# Patient Record
Sex: Female | Born: 1950 | Race: White | Hispanic: No | Smoking: Former smoker
Health system: Southern US, Community
[De-identification: ages and names within clinical notes are randomized; demographics above are authoritative.]

## PROBLEM LIST (undated history)

## (undated) DIAGNOSIS — E039 Hypothyroidism, unspecified: Secondary | ICD-10-CM

## (undated) DIAGNOSIS — I499 Cardiac arrhythmia, unspecified: Secondary | ICD-10-CM

## (undated) DIAGNOSIS — M419 Scoliosis, unspecified: Secondary | ICD-10-CM

## (undated) DIAGNOSIS — Z8619 Personal history of other infectious and parasitic diseases: Secondary | ICD-10-CM

## (undated) DIAGNOSIS — F329 Major depressive disorder, single episode, unspecified: Secondary | ICD-10-CM

## (undated) DIAGNOSIS — C801 Malignant (primary) neoplasm, unspecified: Secondary | ICD-10-CM

## (undated) DIAGNOSIS — C50919 Malignant neoplasm of unspecified site of unspecified female breast: Secondary | ICD-10-CM

## (undated) DIAGNOSIS — E785 Hyperlipidemia, unspecified: Secondary | ICD-10-CM

## (undated) DIAGNOSIS — F32A Depression, unspecified: Secondary | ICD-10-CM

## (undated) DIAGNOSIS — E0590 Thyrotoxicosis, unspecified without thyrotoxic crisis or storm: Secondary | ICD-10-CM

## (undated) DIAGNOSIS — J45909 Unspecified asthma, uncomplicated: Secondary | ICD-10-CM

## (undated) HISTORY — PX: UPPER GI ENDOSCOPY: SHX6162

## (undated) HISTORY — PX: OTHER SURGICAL HISTORY: SHX169

## (undated) HISTORY — PX: ABDOMINAL HYSTERECTOMY: SHX81

## (undated) HISTORY — PX: CHOLECYSTECTOMY: SHX55

## (undated) HISTORY — PX: SPINAL FUSION: SHX223

## (undated) HISTORY — PX: MASTECTOMY: SHX3

---

## 2004-10-23 DIAGNOSIS — E162 Hypoglycemia, unspecified: Secondary | ICD-10-CM | POA: Insufficient documentation

## 2004-12-04 DIAGNOSIS — E038 Other specified hypothyroidism: Secondary | ICD-10-CM | POA: Insufficient documentation

## 2004-12-04 DIAGNOSIS — J019 Acute sinusitis, unspecified: Secondary | ICD-10-CM | POA: Insufficient documentation

## 2005-12-13 DIAGNOSIS — M25559 Pain in unspecified hip: Secondary | ICD-10-CM | POA: Insufficient documentation

## 2012-04-14 DIAGNOSIS — M4120 Other idiopathic scoliosis, site unspecified: Secondary | ICD-10-CM | POA: Insufficient documentation

## 2012-04-14 DIAGNOSIS — M546 Pain in thoracic spine: Secondary | ICD-10-CM | POA: Insufficient documentation

## 2012-06-24 LAB — HM COLONOSCOPY

## 2013-12-28 ENCOUNTER — Other Ambulatory Visit: Payer: Self-pay | Admitting: Family Medicine

## 2013-12-28 LAB — CBC WITH DIFFERENTIAL/PLATELET
Basophil #: 0 10*3/uL (ref 0.0–0.1)
Basophil %: 1.3 %
Eosinophil #: 0.2 10*3/uL (ref 0.0–0.7)
Eosinophil %: 5 %
HCT: 38.6 % (ref 35.0–47.0)
HGB: 13.3 g/dL (ref 12.0–16.0)
Lymphocyte #: 1.1 10*3/uL (ref 1.0–3.6)
Lymphocyte %: 27.6 %
MCH: 30.9 pg (ref 26.0–34.0)
MCHC: 34.6 g/dL (ref 32.0–36.0)
MCV: 89 fL (ref 80–100)
Monocyte #: 0.3 x10 3/mm (ref 0.2–0.9)
Monocyte %: 8.6 %
Neutrophil #: 2.2 10*3/uL (ref 1.4–6.5)
Neutrophil %: 57.5 %
Platelet: 224 10*3/uL (ref 150–440)
RBC: 4.32 10*6/uL (ref 3.80–5.20)
RDW: 12.8 % (ref 11.5–14.5)
WBC: 3.8 10*3/uL (ref 3.6–11.0)

## 2013-12-28 LAB — COMPREHENSIVE METABOLIC PANEL
Albumin: 4.1 g/dL (ref 3.4–5.0)
Alkaline Phosphatase: 70 U/L
Anion Gap: 2 — ABNORMAL LOW (ref 7–16)
BUN: 12 mg/dL (ref 7–18)
Bilirubin,Total: 0.4 mg/dL (ref 0.2–1.0)
Calcium, Total: 9.1 mg/dL (ref 8.5–10.1)
Chloride: 105 mmol/L (ref 98–107)
Co2: 31 mmol/L (ref 21–32)
Creatinine: 0.56 mg/dL — ABNORMAL LOW (ref 0.60–1.30)
EGFR (African American): >60
EGFR (Non-African Amer.): >60
Glucose: 84 mg/dL (ref 65–99)
Osmolality: 275 (ref 275–301)
Potassium: 4.2 mmol/L (ref 3.5–5.1)
SGOT(AST): 23 U/L (ref 15–37)
SGPT (ALT): 27 U/L (ref 12–78)
Sodium: 138 mmol/L (ref 136–145)
Total Protein: 7.5 g/dL (ref 6.4–8.2)

## 2013-12-28 LAB — TSH: Thyroid Stimulating Horm: 2.04 u[IU]/mL

## 2013-12-28 LAB — LIPID PANEL
Cholesterol: 232 mg/dL — ABNORMAL HIGH (ref 0–200)
HDL Cholesterol: 92 mg/dL — ABNORMAL HIGH (ref 40–60)
Ldl Cholesterol, Calc: 119 mg/dL — ABNORMAL HIGH (ref 0–100)
Triglycerides: 105 mg/dL (ref 0–200)
VLDL Cholesterol, Calc: 21 mg/dL (ref 5–40)

## 2014-02-04 ENCOUNTER — Ambulatory Visit: Payer: Self-pay | Admitting: Family Medicine

## 2014-05-04 ENCOUNTER — Ambulatory Visit: Payer: Self-pay | Admitting: Urology

## 2014-05-24 DIAGNOSIS — N390 Urinary tract infection, site not specified: Secondary | ICD-10-CM | POA: Insufficient documentation

## 2015-01-03 ENCOUNTER — Ambulatory Visit: Payer: Self-pay | Admitting: Nurse Practitioner

## 2015-01-03 LAB — COMPREHENSIVE METABOLIC PANEL
Albumin: 4.3 g/dL (ref 3.4–5.0)
Alkaline Phosphatase: 66 U/L
Anion Gap: 6 — ABNORMAL LOW (ref 7–16)
BUN: 11 mg/dL (ref 7–18)
Bilirubin,Total: 0.3 mg/dL (ref 0.2–1.0)
Calcium, Total: 8.5 mg/dL (ref 8.5–10.1)
Chloride: 101 mmol/L (ref 98–107)
Co2: 27 mmol/L (ref 21–32)
Creatinine: 0.64 mg/dL (ref 0.60–1.30)
EGFR (African American): >60
EGFR (Non-African Amer.): >60
Glucose: 86 mg/dL (ref 65–99)
Osmolality: 267 (ref 275–301)
Potassium: 3.7 mmol/L (ref 3.5–5.1)
SGOT(AST): 34 U/L (ref 15–37)
SGPT (ALT): 23 U/L
Sodium: 134 mmol/L — ABNORMAL LOW (ref 136–145)
Total Protein: 7.6 g/dL (ref 6.4–8.2)

## 2015-01-03 LAB — CBC WITH DIFFERENTIAL/PLATELET
Basophil #: 0 10*3/uL (ref 0.0–0.1)
Basophil %: 1.3 %
Eosinophil #: 0.1 10*3/uL (ref 0.0–0.7)
Eosinophil %: 2.4 %
HCT: 41.2 % (ref 35.0–47.0)
HGB: 13.7 g/dL (ref 12.0–16.0)
Lymphocyte #: 1 10*3/uL (ref 1.0–3.6)
Lymphocyte %: 28.2 %
MCH: 30.8 pg (ref 26.0–34.0)
MCHC: 33.2 g/dL (ref 32.0–36.0)
MCV: 93 fL (ref 80–100)
Monocyte #: 0.3 x10 3/mm (ref 0.2–0.9)
Monocyte %: 7.4 %
Neutrophil #: 2.2 10*3/uL (ref 1.4–6.5)
Neutrophil %: 60.7 %
Platelet: 272 10*3/uL (ref 150–440)
RBC: 4.43 10*6/uL (ref 3.80–5.20)
RDW: 12.8 % (ref 11.5–14.5)
WBC: 3.6 10*3/uL (ref 3.6–11.0)

## 2015-01-03 LAB — LIPID PANEL
Cholesterol: 253 mg/dL — ABNORMAL HIGH (ref 0–200)
HDL Cholesterol: 93 mg/dL — ABNORMAL HIGH (ref 40–60)
Ldl Cholesterol, Calc: 146 mg/dL — ABNORMAL HIGH (ref 0–100)
Triglycerides: 68 mg/dL (ref 0–200)
VLDL Cholesterol, Calc: 14 mg/dL (ref 5–40)

## 2015-01-03 LAB — TSH: Thyroid Stimulating Horm: 2.18 u[IU]/mL

## 2015-01-03 LAB — HEMOGLOBIN A1C: Hemoglobin A1C: 5.6 % (ref 4.2–6.3)

## 2015-01-03 LAB — T4, FREE: Free Thyroxine: 1.44 ng/dL (ref 0.76–1.46)

## 2015-01-07 DIAGNOSIS — C50911 Malignant neoplasm of unspecified site of right female breast: Secondary | ICD-10-CM | POA: Insufficient documentation

## 2015-03-09 ENCOUNTER — Ambulatory Visit: Payer: Self-pay | Admitting: Internal Medicine

## 2015-04-29 DIAGNOSIS — M549 Dorsalgia, unspecified: Secondary | ICD-10-CM | POA: Insufficient documentation

## 2015-04-29 DIAGNOSIS — G8929 Other chronic pain: Secondary | ICD-10-CM | POA: Insufficient documentation

## 2015-04-29 DIAGNOSIS — Z853 Personal history of malignant neoplasm of breast: Secondary | ICD-10-CM | POA: Insufficient documentation

## 2015-06-15 ENCOUNTER — Ambulatory Visit (INDEPENDENT_AMBULATORY_CARE_PROVIDER_SITE_OTHER): Payer: BLUE CROSS/BLUE SHIELD

## 2015-06-15 ENCOUNTER — Encounter: Payer: Self-pay | Admitting: Podiatry

## 2015-06-15 ENCOUNTER — Ambulatory Visit (INDEPENDENT_AMBULATORY_CARE_PROVIDER_SITE_OTHER): Payer: BLUE CROSS/BLUE SHIELD | Admitting: Podiatry

## 2015-06-15 VITALS — BP 125/75 | HR 63 | Resp 16

## 2015-06-15 DIAGNOSIS — Q828 Other specified congenital malformations of skin: Secondary | ICD-10-CM | POA: Diagnosis not present

## 2015-06-15 DIAGNOSIS — L84 Corns and callosities: Secondary | ICD-10-CM

## 2015-06-15 DIAGNOSIS — M779 Enthesopathy, unspecified: Secondary | ICD-10-CM

## 2015-06-15 NOTE — Progress Notes (Signed)
   Subjective:    Patient ID: Tanya Snyder, female    DOB: [birth year], 64 y.o.   MRN: [id number]  HPI Comments: "I have a callus"  Patient c/o aching plantar forefoot left for several months. The area is callused. She has tried trimming, padding and medicated disks. Not helping and becoming more sore with walking.     Review of Systems  Eyes: Positive for itching.  Respiratory: Positive for shortness of breath.   Endocrine: Positive for cold intolerance.  Musculoskeletal: Positive for back pain.  Allergic/Immunologic: Positive for environmental allergies.  Hematological: Bruises/bleeds easily.  All other systems reviewed and are negative.      Objective:   Physical Exam: I have reviewed her past medical history medications allergies surgery social history and review of systems area pulses are strongly palpable bilateral. Neurologic sensorium is intact per Semmes-Weinstein monofilament. Deep tendon reflexes are intact bilaterally muscle strength +5 over 5 dorsiflexion plantar flexors and inverters and everters all under the musculature is intact. Orthopedic evaluation demonstrates mild digital contraction flexible in nature toes 2 through 5 left. She has very prominent metatarsal heads to the third and fourth of the left foot. Cutaneous evaluation of straight supple well-hydrated cutis no erythema edema saline as drainage or odor with the exception of a mild area of erythema sub-third fourth metatarsophalangeal joint of the left foot where she has applied multiple skin products to help alleviate her symptoms. At this point a solitary 4 keratome was visualized beneath third metatarsophalangeal joint of the left foot. Skin is thin but does not appear to be infected.        Assessment & Plan:  Assessment: Fat pad atrophy with mild digital contraction leading to a porokeratosis sub-third metatarsal head left foot.  Plan: Discussed etiology pathology conservative or surgical  therapies sharp debridement today with chemical destruction under occlusion for 2-3 days. She will follow up with me as needed. We did discuss other alternatives such as orthotics as well as accommodation of other insoles.

## 2015-08-31 ENCOUNTER — Encounter: Payer: Self-pay | Admitting: *Deleted

## 2015-09-01 ENCOUNTER — Encounter
Admission: RE | Disposition: A | Discharge status: Home / Self Care | Payer: Self-pay | Source: Ambulatory Visit | Attending: Gastroenterology

## 2015-09-01 ENCOUNTER — Encounter: Payer: Self-pay | Admitting: Certified Registered Nurse Anesthetist

## 2015-09-01 ENCOUNTER — Ambulatory Visit
Admission: RE | Admit: 2015-09-01 | Discharge: 2015-09-01 | Disposition: A | Discharge status: Home / Self Care | Payer: Medicare Other | Source: Ambulatory Visit | Attending: Gastroenterology | Admitting: Gastroenterology

## 2015-09-01 DIAGNOSIS — F329 Major depressive disorder, single episode, unspecified: Secondary | ICD-10-CM | POA: Diagnosis not present

## 2015-09-01 DIAGNOSIS — Z09 Encounter for follow-up examination after completed treatment for conditions other than malignant neoplasm: Secondary | ICD-10-CM | POA: Insufficient documentation

## 2015-09-01 DIAGNOSIS — Z7951 Long term (current) use of inhaled steroids: Secondary | ICD-10-CM | POA: Insufficient documentation

## 2015-09-01 DIAGNOSIS — Z90710 Acquired absence of both cervix and uterus: Secondary | ICD-10-CM | POA: Diagnosis not present

## 2015-09-01 DIAGNOSIS — Z79891 Long term (current) use of opiate analgesic: Secondary | ICD-10-CM | POA: Diagnosis not present

## 2015-09-01 DIAGNOSIS — K644 Residual hemorrhoidal skin tags: Secondary | ICD-10-CM | POA: Diagnosis not present

## 2015-09-01 DIAGNOSIS — Z79899 Other long term (current) drug therapy: Secondary | ICD-10-CM | POA: Insufficient documentation

## 2015-09-01 DIAGNOSIS — Z981 Arthrodesis status: Secondary | ICD-10-CM | POA: Diagnosis not present

## 2015-09-01 DIAGNOSIS — J45909 Unspecified asthma, uncomplicated: Secondary | ICD-10-CM | POA: Diagnosis not present

## 2015-09-01 DIAGNOSIS — Z86010 Personal history of colonic polyps: Secondary | ICD-10-CM | POA: Diagnosis present

## 2015-09-01 DIAGNOSIS — Z853 Personal history of malignant neoplasm of breast: Secondary | ICD-10-CM | POA: Insufficient documentation

## 2015-09-01 DIAGNOSIS — E785 Hyperlipidemia, unspecified: Secondary | ICD-10-CM | POA: Diagnosis not present

## 2015-09-01 DIAGNOSIS — Z9010 Acquired absence of unspecified breast and nipple: Secondary | ICD-10-CM | POA: Insufficient documentation

## 2015-09-01 HISTORY — DX: Hyperlipidemia, unspecified: E78.5

## 2015-09-01 HISTORY — DX: Malignant neoplasm of unspecified site of unspecified female breast: C50.919

## 2015-09-01 HISTORY — DX: Unspecified asthma, uncomplicated: J45.909

## 2015-09-01 HISTORY — DX: Thyrotoxicosis, unspecified without thyrotoxic crisis or storm: E05.90

## 2015-09-01 HISTORY — DX: Major depressive disorder, single episode, unspecified: F32.9

## 2015-09-01 HISTORY — DX: Malignant (primary) neoplasm, unspecified: C80.1

## 2015-09-01 HISTORY — DX: Personal history of other infectious and parasitic diseases: Z86.19

## 2015-09-01 HISTORY — PX: COLONOSCOPY WITH PROPOFOL: SHX5780

## 2015-09-01 HISTORY — DX: Depression, unspecified: F32.A

## 2015-09-01 SURGERY — COLONOSCOPY WITH PROPOFOL
Anesthesia: General

## 2015-09-01 MED ORDER — MIDAZOLAM HCL 5 MG/5ML IJ SOLN
INTRAMUSCULAR | Status: DC | PRN
  Administered 2015-09-01 (×2): 1 mg via INTRAVENOUS
  Administered 2015-09-01 (×2): 2 mg via INTRAVENOUS

## 2015-09-01 MED ORDER — FENTANYL CITRATE (PF) 100 MCG/2ML IJ SOLN
INTRAMUSCULAR | Status: AC
  Filled 2015-09-01: qty 2

## 2015-09-01 MED ORDER — SODIUM CHLORIDE 0.9 % IV SOLN: INTRAVENOUS | Status: DC

## 2015-09-01 MED ORDER — FENTANYL CITRATE (PF) 100 MCG/2ML IJ SOLN
INTRAMUSCULAR | Status: DC | PRN
  Administered 2015-09-01: 25 ug via INTRAVENOUS
  Administered 2015-09-01 (×2): 50 ug via INTRAVENOUS
  Administered 2015-09-01: 25 ug via INTRAVENOUS

## 2015-09-01 MED ORDER — DIPHENHYDRAMINE HCL 50 MG/ML IJ SOLN
INTRAMUSCULAR | Status: DC | PRN
  Administered 2015-09-01: 25 mg via INTRAVENOUS

## 2015-09-01 MED ORDER — DIPHENHYDRAMINE HCL 50 MG/ML IJ SOLN
INTRAMUSCULAR | Status: AC
  Filled 2015-09-01: qty 1

## 2015-09-01 MED ORDER — MIDAZOLAM HCL 5 MG/5ML IJ SOLN
INTRAMUSCULAR | Status: AC
  Filled 2015-09-01: qty 10

## 2015-09-01 MED ORDER — SODIUM CHLORIDE 0.9 % IV SOLN
INTRAVENOUS | Status: DC
Start: 2015-09-01 — End: 2015-09-01
  Administered 2015-09-01: 1000 mL via INTRAVENOUS

## 2015-09-01 NOTE — H&P (Signed)
Primary Care Physician:  Tracie Harrier, MD  Pre-Procedure History & Physical: HPI:  Tanya Snyder is a 64 y.o. female is here for an colonoscopy.   Past Medical History  Diagnosis Date  . History of chicken pox   . Cancer   . Breast cancer   . Asthma   . Depression   . Hyperthyroidism   . Hyperlipemia     Past Surgical History  Procedure Laterality Date  . Spinal fusion    . Abdominal hysterectomy    . Mastectomy      Prior to Admission medications   Medication Sig Start Date End Date Taking? Authorizing Provider  alendronate (FOSAMAX) 70 MG tablet Take 70 mg by mouth once a week. Take with a full glass of water on an empty stomach.   Yes Historical Provider, MD  aluminum hydroxide-magnesium carbonate (GAVISCON) 95-358 MG/15ML SUSP Take 15 mLs by mouth.   Yes Historical Provider, MD  ascorbic acid (VITAMIN C) 1000 MG tablet Take 1,000 mg by mouth daily.   Yes Historical Provider, MD  calcium carbonate (TUMS - DOSED IN MG ELEMENTAL CALCIUM) 500 MG chewable tablet Chew 1 tablet by mouth daily.   Yes Historical Provider, MD  clonazePAM (KLONOPIN) 0.5 MG tablet Take 0.5 mg by mouth 2 (two) times daily as needed for anxiety.   Yes Historical Provider, MD  D-MANNOSE PO Take by mouth.   Yes Historical Provider, MD  diphenhydrAMINE (BENADRYL) 25 MG tablet Take 25 mg by mouth every 6 (six) hours as needed.   Yes Historical Provider, MD  Fluticasone-Salmeterol (ADVAIR HFA IN) Inhale into the lungs.   Yes Historical Provider, MD  gabapentin (NEURONTIN) 100 MG capsule Take 100 mg by mouth 3 (three) times daily.   Yes Historical Provider, MD  Levothyroxine Sodium (SYNTHROID PO) Take by mouth.   Yes Historical Provider, MD  Methenamine-Sodium Salicylate (CYSTEX) 297-989.2 MG TABS Take by mouth.   Yes Historical Provider, MD  oxyCODONE-acetaminophen (PERCOCET/ROXICET) 5-325 MG per tablet Take 1 tablet by mouth every 4 (four) hours as needed for severe pain.   Yes Historical  Provider, MD    Allergies as of 06/23/2015  . (No Known Allergies)    History reviewed. No pertinent family history.  Social History   Social History  . Marital Status: Divorced    Spouse Name: N/A  . Number of Children: N/A  . Years of Education: N/A   Occupational History  . Not on file.   Social History Main Topics  . Smoking status: Never Smoker   . Smokeless tobacco: Not on file  . Alcohol Use: No  . Drug Use: Not on file  . Sexual Activity: Not on file   Other Topics Concern  . Not on file   Social History Narrative     Physical Exam: BP 130/84 mmHg  Pulse 79  Temp(Src) 97.8 F (36.6 C) (Tympanic)  Resp 16  Ht 5\' 9"  (1.753 m)  Wt 68.04 kg (150 lb)  BMI 22.14 kg/m2  SpO2 100% General:   Alert,  pleasant and cooperative in NAD Head:  Normocephalic and atraumatic. Neck:  Supple; no masses or thyromegaly. Lungs:  Clear throughout to auscultation.    Heart:  Regular rate and rhythm. Abdomen:  Soft, nontender and nondistended. Normal bowel sounds, without guarding, and without rebound.   Neurologic:  Alert and  oriented x4;  grossly normal neurologically.  Impression/Plan: Tanya Snyder is here for an colonoscopy to be performed for suveillancce, hx TA  Risks, benefits,  limitations, and alternatives regarding  colonoscopy have been reviewed with the patient.  Questions have been answered.  All parties agreeable.   Josefine Class, MD  09/01/2015, 9:39 AM

## 2015-09-01 NOTE — Op Note (Signed)
[facility] Patient Name: Tanya Snyder Procedure Date: 09/01/2015 9:43 AM MRN: [id number] Account #: [id number] Date of Birth: [birth year] Admit Type: Outpatient Age: 64 Room: [facility] ENDO ROOM 3 Gender: Female Note Status: Finalized Procedure:         Colonoscopy Indications:       Surveillance: Personal history of adenomatous polyps on                     last colonoscopy 3 years ago Patient Profile:   This is a 64 year old female. Providers:         Gerrit Heck. Rayann Heman, MD Referring MD:      Tracie Harrier, MD (Referring MD) Medicines:         Fentanyl 150 micrograms IV, Midazolam 6 mg IV,                     Diphenhydramine 25 mg IV Complications:     No immediate complications. Procedure:         Pre-Anesthesia Assessment:                    - Prior to the procedure, a History and Physical was                     performed, and patient medications, allergies and                     sensitivities were reviewed. The patient's tolerance of                     previous anesthesia was reviewed.                    - Prior to the procedure, a History and Physical was                     performed, and patient medications, allergies and                     sensitivities were reviewed. The patient's tolerance of                     previous anesthesia was reviewed.                    After obtaining informed consent, the colonoscope was                     passed under direct vision. Throughout the procedure, the                     patient's blood pressure, pulse, and oxygen saturations                     were monitored continuously. The Colonoscope was                     introduced through the anus and advanced to the the cecum,                     identified by appendiceal orifice and ileocecal valve. The                     colonoscopy was performed without difficulty. The patient  tolerated the procedure well. The quality  of the bowel                     preparation was excellent. Findings:      The perianal exam findings include non-thrombosed external hemorrhoids.      The entire examined colon appeared normal on direct and retroflexion       views. Impression:        - Non-thrombosed external hemorrhoids found on perianal                     exam.                    - The entire examined colon is normal on direct and                     retroflexion views.                    - No specimens collected. Recommendation:    - Observe patient in GI recovery unit.                    - High fiber diet.                    - Continue present medications.                    - Repeat colonoscopy in 5 years for surveillance.                    - Return to referring physician.                    - The findings and recommendations were discussed with the                     patient.                    - The findings and recommendations were discussed with the                     patient's family. Procedure Code(s): --- Professional ---                    [phone], Colonoscopy, flexible; diagnostic, including                     collection of specimen(s) by brushing or washing, when                     performed (separate procedure) CPT copyright 2014 American Medical [employer]. All rights reserved. The codes documented in this report are preliminary and upon coder review may  be revised to meet current compliance requirements. Mellody Life, MD 09/01/2015 10:26:26 AM This report has been signed electronically. Number of Addenda: 0 Note Initiated On: 09/01/2015 9:43 AM Scope Withdrawal Time: 0 hours 9 minutes 17 seconds  Total Procedure Duration: 0 hours 18 minutes 40 seconds       [facility]

## 2015-09-02 ENCOUNTER — Encounter: Payer: Self-pay | Admitting: Gastroenterology

## 2015-11-09 ENCOUNTER — Ambulatory Visit: Payer: BLUE CROSS/BLUE SHIELD | Admitting: Podiatry

## 2015-11-30 ENCOUNTER — Ambulatory Visit: Payer: Medicare Other | Admitting: Podiatry

## 2015-12-07 ENCOUNTER — Ambulatory Visit (INDEPENDENT_AMBULATORY_CARE_PROVIDER_SITE_OTHER): Payer: Medicare Other | Admitting: Podiatry

## 2015-12-07 ENCOUNTER — Encounter: Payer: Self-pay | Admitting: Podiatry

## 2015-12-07 DIAGNOSIS — B070 Plantar wart: Secondary | ICD-10-CM | POA: Diagnosis not present

## 2015-12-07 DIAGNOSIS — Q828 Other specified congenital malformations of skin: Secondary | ICD-10-CM

## 2015-12-07 NOTE — Progress Notes (Signed)
She presents today with a chief complaint of a painful lesion to the plantar aspect of her left foot would like to have trimmed.  Objective: Vital signs are stable she is alert and oriented 3. Pulses are strongly palpable. Porokeratotic lesion which appears to be developing some early changes of verruca sub-fourth metatarsal phalangeal joint area of the left foot.  Assessment: Porokeratosis left foot.  Plan: Debridement of reactive hyperkeratotic tissue and then placed salicylic acid under occlusion to be left on for 3 days without getting wet. I will follow-up with her in the near future for reevaluation.

## 2016-03-07 ENCOUNTER — Ambulatory Visit: Payer: Medicare Other | Admitting: Podiatry

## 2016-03-14 ENCOUNTER — Ambulatory Visit: Payer: Medicare Other | Admitting: Podiatry

## 2016-05-15 ENCOUNTER — Ambulatory Visit: Payer: Medicare Other | Admitting: Podiatry

## 2016-05-30 ENCOUNTER — Encounter: Payer: Self-pay | Admitting: Podiatry

## 2016-05-30 ENCOUNTER — Ambulatory Visit (INDEPENDENT_AMBULATORY_CARE_PROVIDER_SITE_OTHER): Payer: Medicare Other | Admitting: Podiatry

## 2016-05-30 DIAGNOSIS — Q828 Other specified congenital malformations of skin: Secondary | ICD-10-CM

## 2016-05-30 NOTE — Progress Notes (Signed)
She presents today with chief complaint of painful porokeratotic lesions plantar aspect bilateral foot.  Objective: Vital signs are stable she is alert and oriented 3 multiple porokeratosis. Most painful lesion is some fifth metatarsal head of the left foot which is ears to be really deep. No open lesions or wounds are noted.  Assessment: Pain in limb secondary porokeratosis bilateral.  Plan: Mechanical debridement as well as chemical debridement under occlusion was also requested to be left on for 3 days without getting wet. She is to remove the bandage at that time a wash the foot thoroughly. Otherwise I will follow up with her on an as-needed basis.

## 2016-08-07 DIAGNOSIS — M76891 Other specified enthesopathies of right lower limb, excluding foot: Secondary | ICD-10-CM | POA: Insufficient documentation

## 2016-08-16 DIAGNOSIS — G8929 Other chronic pain: Secondary | ICD-10-CM | POA: Insufficient documentation

## 2016-09-03 ENCOUNTER — Ambulatory Visit (INDEPENDENT_AMBULATORY_CARE_PROVIDER_SITE_OTHER): Payer: Medicare Other | Admitting: Podiatry

## 2016-09-03 ENCOUNTER — Encounter: Payer: Self-pay | Admitting: Podiatry

## 2016-09-03 DIAGNOSIS — Q828 Other specified congenital malformations of skin: Secondary | ICD-10-CM | POA: Diagnosis not present

## 2016-09-03 NOTE — Progress Notes (Signed)
She presents today for follow-up of calluses to the plantar aspect the bilateral foot left being greater than the right. She states that she is overweight of [place] would like to be able to walk.  Objective: Vital signs are stable she's alert 3. Pulses are palpable. Reactive hyperkeratosis plantar aspect of the bilateral foot left greater than right no open lesions or wounds.  Assessment: Porokeratosis left greater than right.  Plan: Debridement of all reactive hyperkeratosis manually and chemically on the left foot. She will leave this on salicylic acid under occlusion for 3 days then washed off thoroughly notify me with questions or concerns. Follow up with me in a few months.

## 2016-12-21 DIAGNOSIS — Z87440 Personal history of urinary (tract) infections: Secondary | ICD-10-CM | POA: Insufficient documentation

## 2016-12-21 DIAGNOSIS — F988 Other specified behavioral and emotional disorders with onset usually occurring in childhood and adolescence: Secondary | ICD-10-CM | POA: Insufficient documentation

## 2017-04-22 ENCOUNTER — Ambulatory Visit (INDEPENDENT_AMBULATORY_CARE_PROVIDER_SITE_OTHER): Payer: Medicare Other | Admitting: Podiatry

## 2017-04-22 ENCOUNTER — Encounter: Payer: Self-pay | Admitting: Podiatry

## 2017-04-22 DIAGNOSIS — Q828 Other specified congenital malformations of skin: Secondary | ICD-10-CM

## 2017-04-22 NOTE — Progress Notes (Signed)
She is presenting today to complaint of painful calluses plantar aspect of bilateral forefoot.  Objective: Vital signs are stable she is alert and oriented 3. Pulses are palpable. She has multiple porokeratotic lesions to the forefoot bilateral 1 on the plantar lateral aspect of the left foot just distal to the heel. No open lesions no open wounds no signs of infection.  Assessment: Porokeratosis bilateral.  Plan: Debridement of reactive hyperkeratosis bilateral.

## 2017-09-18 ENCOUNTER — Encounter: Payer: Self-pay | Admitting: Podiatry

## 2017-09-18 ENCOUNTER — Ambulatory Visit (INDEPENDENT_AMBULATORY_CARE_PROVIDER_SITE_OTHER): Payer: Medicare Other | Admitting: Podiatry

## 2017-09-18 DIAGNOSIS — M779 Enthesopathy, unspecified: Secondary | ICD-10-CM

## 2017-09-18 DIAGNOSIS — Q828 Other specified congenital malformations of skin: Secondary | ICD-10-CM | POA: Diagnosis not present

## 2017-09-18 NOTE — Progress Notes (Signed)
She presents today for follow-up of calluses plantar aspect of the forefoot bilateral. Also complaining of pain to the forefoot right.  Objective: Vital signs are stable she is alert and oriented 3. Pulses are palpable. Neurologic sensorium is intact. She has pain on palpation and range of motion of the second third metatarsophalangeal joints of the right foot. Small reactive hyperkeratosis plantar aspect of the forefoot.  Assessment: Capsulitis bursitis forefoot right. Multiple benign soft tissue lesions plantar aspect of bilateral foot.  Plan: Debridement of all reactive hyperkeratotic tissue injected 20 mg of Kenalog and local anesthetic to the third interspace of the right foot.

## 2017-09-24 ENCOUNTER — Ambulatory Visit (INDEPENDENT_AMBULATORY_CARE_PROVIDER_SITE_OTHER): Payer: Medicare Other | Admitting: Psychology

## 2017-09-24 DIAGNOSIS — F4323 Adjustment disorder with mixed anxiety and depressed mood: Secondary | ICD-10-CM | POA: Diagnosis not present

## 2017-10-10 ENCOUNTER — Ambulatory Visit (INDEPENDENT_AMBULATORY_CARE_PROVIDER_SITE_OTHER): Payer: Medicare Other | Admitting: Psychology

## 2017-10-10 DIAGNOSIS — F4323 Adjustment disorder with mixed anxiety and depressed mood: Secondary | ICD-10-CM

## 2017-10-23 ENCOUNTER — Ambulatory Visit (INDEPENDENT_AMBULATORY_CARE_PROVIDER_SITE_OTHER): Payer: Medicare Other | Admitting: Psychology

## 2017-10-23 DIAGNOSIS — F4323 Adjustment disorder with mixed anxiety and depressed mood: Secondary | ICD-10-CM

## 2017-11-11 ENCOUNTER — Ambulatory Visit: Payer: Medicare Other | Admitting: Podiatry

## 2017-11-13 ENCOUNTER — Ambulatory Visit (INDEPENDENT_AMBULATORY_CARE_PROVIDER_SITE_OTHER): Payer: Medicare Other | Admitting: Psychology

## 2017-11-13 DIAGNOSIS — F4323 Adjustment disorder with mixed anxiety and depressed mood: Secondary | ICD-10-CM

## 2017-11-27 ENCOUNTER — Ambulatory Visit: Payer: Medicare Other | Admitting: Psychology

## 2017-12-23 ENCOUNTER — Ambulatory Visit: Payer: Medicare Other | Admitting: Podiatry

## 2018-04-09 ENCOUNTER — Other Ambulatory Visit: Payer: Self-pay

## 2018-04-09 ENCOUNTER — Encounter: Payer: Self-pay | Admitting: Emergency Medicine

## 2018-04-09 ENCOUNTER — Emergency Department
Admission: EM | Admit: 2018-04-09 | Discharge: 2018-04-09 | Disposition: A | Discharge status: Home / Self Care | Payer: Medicare PPO | Attending: Emergency Medicine | Admitting: Emergency Medicine

## 2018-04-09 DIAGNOSIS — J45909 Unspecified asthma, uncomplicated: Secondary | ICD-10-CM | POA: Diagnosis not present

## 2018-04-09 DIAGNOSIS — L03211 Cellulitis of face: Secondary | ICD-10-CM

## 2018-04-09 DIAGNOSIS — R51 Headache: Secondary | ICD-10-CM | POA: Diagnosis present

## 2018-04-09 DIAGNOSIS — A46 Erysipelas: Secondary | ICD-10-CM | POA: Insufficient documentation

## 2018-04-09 DIAGNOSIS — Z853 Personal history of malignant neoplasm of breast: Secondary | ICD-10-CM | POA: Insufficient documentation

## 2018-04-09 MED ORDER — AMOXICILLIN-POT CLAVULANATE 875-125 MG PO TABS
1.0000 | ORAL_TABLET | Freq: Once | ORAL | Status: AC
  Administered 2018-04-09: 1 via ORAL
  Filled 2018-04-09: qty 1

## 2018-04-09 MED ORDER — PREDNISONE 20 MG PO TABS: 40.0000 mg | ORAL_TABLET | Freq: Every day | ORAL | 0 refills | Status: AC

## 2018-04-09 MED ORDER — AMOXICILLIN-POT CLAVULANATE 875-125 MG PO TABS: 1.0000 | ORAL_TABLET | Freq: Two times a day (BID) | ORAL | 0 refills | Status: AC

## 2018-04-09 MED ORDER — PREDNISONE 20 MG PO TABS
40.0000 mg | ORAL_TABLET | Freq: Once | ORAL | Status: AC
  Administered 2018-04-09: 40 mg via ORAL
  Filled 2018-04-09: qty 2

## 2018-04-09 NOTE — ED Provider Notes (Signed)
[facility] Emergency Department Provider Note       Time seen: ----------------------------------------- 7:18 AM on 04/09/2018 -----------------------------------------   I have reviewed the triage vital signs and the nursing notes.  HISTORY   Chief Complaint facial pain    HPI Tanya Snyder is a 67 y.o. female with a history of asthma, cancer, depression, hyperlipidemia and hyperthyroidism who presents to the ED for right-sided facial pain.  Patient states it feels warm to the touch, she is not sure if it was her ear or if it was dental or could be sinus related.  Patient states she received a cortisone injection in her hip to make sure it did not cause the infection.  She denies fevers, chills or other complaints.  Past Medical History:  Diagnosis Date  . Asthma   . Breast cancer ([place])   . Cancer ([place])   . Depression   . History of chicken pox   . Hyperlipemia   . Hyperthyroidism     There are no active problems to display for this patient.   Past Surgical History:  Procedure Laterality Date  . ABDOMINAL HYSTERECTOMY    . COLONOSCOPY WITH PROPOFOL N/A 09/01/2015   Procedure: COLONOSCOPY WITH PROPOFOL;  Surgeon: Josefine Class, MD;  Location: [facility] ENDOSCOPY;  Service: Endoscopy;  Laterality: N/A;  . MASTECTOMY    . SPINAL FUSION      Allergies Patient has no known allergies.  Social History Social History   Tobacco Use  . Smoking status: Never Smoker  . Smokeless tobacco: Never Used  Substance Use Topics  . Alcohol use: No    Alcohol/week: 0.0 oz  . Drug use: Not on file   Review of Systems Constitutional: Negative for fever. ENT: Positive for right-sided facial pain and warmth Cardiovascular: Negative for chest pain. Respiratory: Negative for shortness of breath. Gastrointestinal: Negative for abdominal pain, vomiting and diarrhea. Musculoskeletal: Negative for back pain. Skin: Positive for facial  erythema Neurological: Negative for headaches, focal weakness or numbness.  All systems negative/normal/unremarkable except as stated in the HPI  ____________________________________________   PHYSICAL EXAM:  VITAL SIGNS: ED Triage Vitals  Enc Vitals Group     BP 04/09/18 0713 (!) 182/98     Pulse Rate 04/09/18 0713 85     Resp 04/09/18 0713 16     Temp 04/09/18 0713 98.7 F (37.1 C)     Temp Source 04/09/18 0713 Oral     SpO2 04/09/18 0713 97 %     Weight 04/09/18 0713 154 lb (69.9 kg)     Height 04/09/18 0713 5\' 9"  (1.753 m)     Head Circumference --      Peak Flow --      Pain Score 04/09/18 0712 7     Pain Loc --      Pain Edu? --      Excl. in [place]? --    Constitutional: Alert and oriented.  Anxious, no distress Eyes: Conjunctivae are normal. Normal extraocular movements. ENT   Head: Normocephalic and atraumatic.  Right-sided facial swelling and erythema with sharply demarcated borders, right greater than left.  TMs are clear   Nose: No congestion/rhinnorhea.   Mouth/Throat: Mucous membranes are moist.  Good dentition, no tenderness   Neck: No stridor. Cardiovascular: Normal rate, regular rhythm. No murmurs, rubs, or gallops. Respiratory: Normal respiratory effort without tachypnea nor retractions. Breath sounds are clear and equal bilaterally. No wheezes/rales/rhonchi. Musculoskeletal: Nontender with normal range of motion in extremities. No  lower extremity tenderness nor edema. Neurologic:  Normal speech and language. No gross focal neurologic deficits are appreciated.  Skin: Facial erythema, right greater than left Psychiatric: Anxious ____________________________________________  ED COURSE:  As part of my medical decision making, I reviewed the following data within the [facility] History obtained from family if available, nursing notes, old chart and ekg, as well as notes from prior ED visits. Patient presented for redness and  swelling which appears to be cellulitis, patient be started on Augmentin and prednisone.   Procedures ____________________________________________  DIFFERENTIAL DIAGNOSIS   Cellulitis, abscess unlikely, lupus unlikely  FINAL ASSESSMENT AND PLAN  Erysipelas   Plan: The patient had presented for facial swelling and erythema.  Clinically she has facial cellulitis and will be started on Augmentin and a short burst of steroids.  Patient is agreeable to plan, stable for outpatient follow-up.   Laurence Aly, MD   Note: This note was generated in part or whole with voice recognition software. Voice recognition is usually quite accurate but there are transcription errors that can and very often do occur. I apologize for any typographical errors that were not detected and corrected.     Earleen Newport, MD 04/09/18 [phone]

## 2018-04-09 NOTE — ED Triage Notes (Signed)
Pt here for right side face pain to cheek.  "I don't know if it is my ear, could be dental, could be sinus".  Reports right cheek feels warm.  Both cheeks feel warm to touch.  "I just got a cortisone injection in my hip and want to make sure it is not causing infection".  All symptoms to face.  Pt also thinks could be having a stroke. Speech clear.  Ambulatory with steady gait.  No facial asymmetry.  Very anxious in triage. When pt asked temperature and RN told her no fever 98.7  Pt states "that is for me I run a whole degree lower."

## 2018-04-09 NOTE — ED Notes (Signed)
Pt notified nurse of swelling of throat. MD Jimmye Norman notified and at bedside.

## 2018-06-02 ENCOUNTER — Ambulatory Visit: Payer: Medicare Other | Admitting: Podiatry

## 2018-06-02 ENCOUNTER — Encounter: Payer: Self-pay | Admitting: Podiatry

## 2018-06-02 DIAGNOSIS — Q828 Other specified congenital malformations of skin: Secondary | ICD-10-CM

## 2018-06-02 NOTE — Progress Notes (Signed)
Complaint:  Visit Type: Patient returns to my office for continued preventative foot care services. Complaint: Patient states she is going on vacation and desires to have her callus worked on before her vacation.  The patient presents for preventative foot care services. No changes to ROS  Podiatric Exam: Vascular: dorsalis pedis and posterior tibial pulses are palpable bilateral. Capillary return is immediate. Temperature gradient is WNL. Skin turgor WNL  Sensorium: Normal Semmes Weinstein monofilament test. Normal tactile sensation bilaterally. Nail Exam: Normal nails with no evidence of fungal or bacterial infection. Ulcer Exam: There is no evidence of ulcer or pre-ulcerative changes or infection. Orthopedic Exam: Muscle tone and strength are WNL. No limitations in general ROM. No crepitus or effusions noted. Foot type and digits show no abnormalities. Bony prominences are unremarkable. Skin:  Porokeratosis sub 4 left and sub 3 right. No infection or ulcers  Diagnosis:Porokeratosis  B/L  Treatment & Plan Procedures and Treatment: Consent by patient was obtained for treatment procedures.   Debridement of porokeratotic lesions.   Return Visit-Office Procedure: Patient instructed to return to the office for a follow up visit 3 months for continued evaluation and treatment.    Gardiner Barefoot DPM

## 2018-06-20 ENCOUNTER — Other Ambulatory Visit: Payer: Self-pay | Admitting: Gastroenterology

## 2018-06-20 DIAGNOSIS — R1013 Epigastric pain: Secondary | ICD-10-CM

## 2018-06-25 ENCOUNTER — Ambulatory Visit
Admission: RE | Admit: 2018-06-25 | Discharge: 2018-06-25 | Disposition: A | Discharge status: Home / Self Care | Payer: Medicare PPO | Source: Ambulatory Visit | Attending: Gastroenterology | Admitting: Gastroenterology

## 2018-06-25 DIAGNOSIS — R1013 Epigastric pain: Secondary | ICD-10-CM | POA: Diagnosis present

## 2018-06-25 DIAGNOSIS — I700 Atherosclerosis of aorta: Secondary | ICD-10-CM | POA: Diagnosis not present

## 2018-08-30 DIAGNOSIS — N2889 Other specified disorders of kidney and ureter: Secondary | ICD-10-CM | POA: Insufficient documentation

## 2018-09-03 ENCOUNTER — Ambulatory Visit (INDEPENDENT_AMBULATORY_CARE_PROVIDER_SITE_OTHER): Payer: Medicare PPO | Admitting: Podiatry

## 2018-09-03 ENCOUNTER — Encounter: Payer: Self-pay | Admitting: Podiatry

## 2018-09-03 DIAGNOSIS — M216X9 Other acquired deformities of unspecified foot: Secondary | ICD-10-CM

## 2018-09-03 NOTE — Progress Notes (Signed)
She presents today would like to have her calluses trimmed by Dr. Sharyon Cable makes it cost too much.  States she has tried doing it herself and has resulted in severe calluses and blisters and superficial ulcerations.  So she wants [place] to do it for her however she was the insurance to pay for it.  Objective: Vital signs are stable she is stable she is alert and oriented x3.  Reactive hyper keratomas appear to be in good shape at this point plantar aspect of the bilateral foot there is no erythema cellulitis drainage or odor and appear to be thin down quite nicely.  Assessment: Reactive porokeratosis plantar aspect of the bilateral foot.  Plan: We discussed this in great detail today she understands that insurance will no longer pay this and that she will have to do this herself or she will have to pay for [place] to do it she does not like those options very much but states that she will notify us should she have any

## 2019-03-18 DIAGNOSIS — Z9011 Acquired absence of right breast and nipple: Secondary | ICD-10-CM | POA: Insufficient documentation

## 2019-06-05 DIAGNOSIS — I341 Nonrheumatic mitral (valve) prolapse: Secondary | ICD-10-CM | POA: Insufficient documentation

## 2019-09-23 ENCOUNTER — Other Ambulatory Visit: Payer: Self-pay | Admitting: Otolaryngology

## 2019-09-23 DIAGNOSIS — H9201 Otalgia, right ear: Secondary | ICD-10-CM

## 2019-10-05 ENCOUNTER — Other Ambulatory Visit: Payer: Self-pay

## 2019-10-05 ENCOUNTER — Ambulatory Visit
Admission: RE | Admit: 2019-10-05 | Discharge: 2019-10-05 | Disposition: A | Discharge status: Home / Self Care | Payer: Medicare Other | Source: Ambulatory Visit | Attending: Otolaryngology | Admitting: Otolaryngology

## 2019-10-05 DIAGNOSIS — H9201 Otalgia, right ear: Secondary | ICD-10-CM | POA: Insufficient documentation

## 2020-01-20 IMAGING — US US ABDOMEN COMPLETE
1 series · 14 of 25 positions shown · non-contrast
Comparison: CT abdomen pelvis 05/04/2014

CLINICAL DATA: Epigastric pain for 6 months

EXAM:
ABDOMEN ULTRASOUND COMPLETE

[Series 1: us abdomen complete · 0.19mm/px · 14 of 124 slices shown]
[im 1/124]
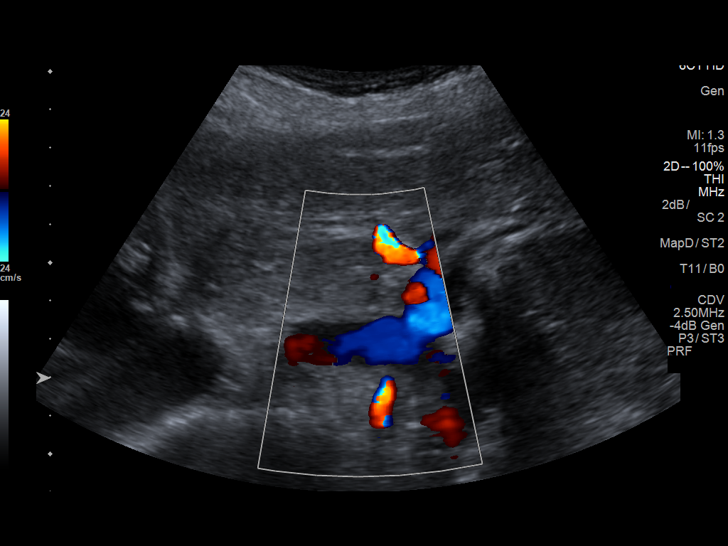
[im 11/124]
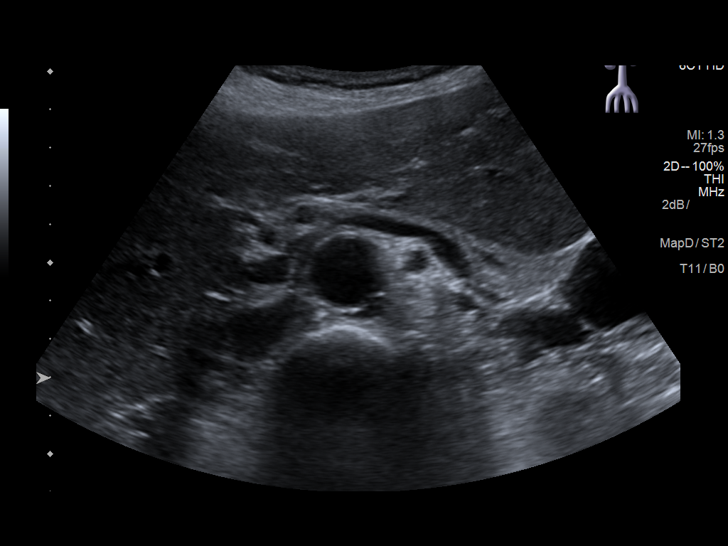
[im 21/124]
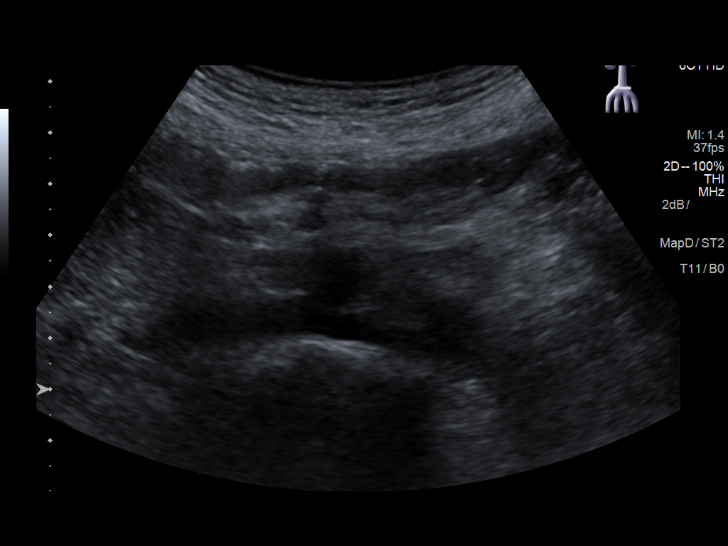
[im 31/124]
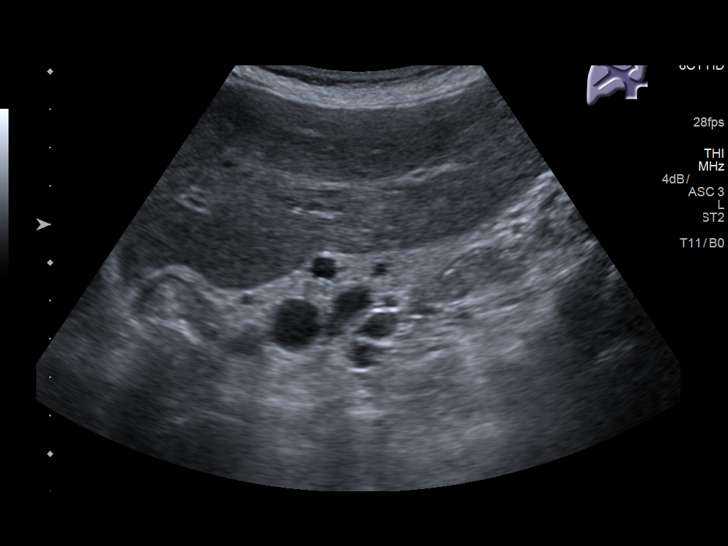
[im 42/124]
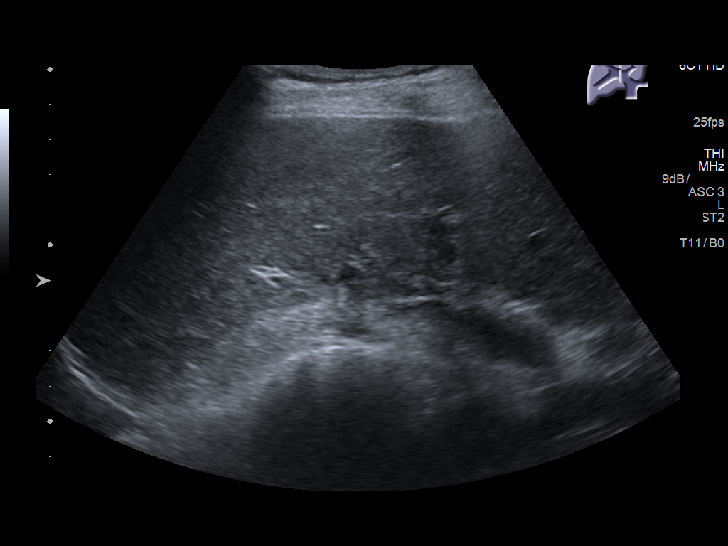
[im 47/124]
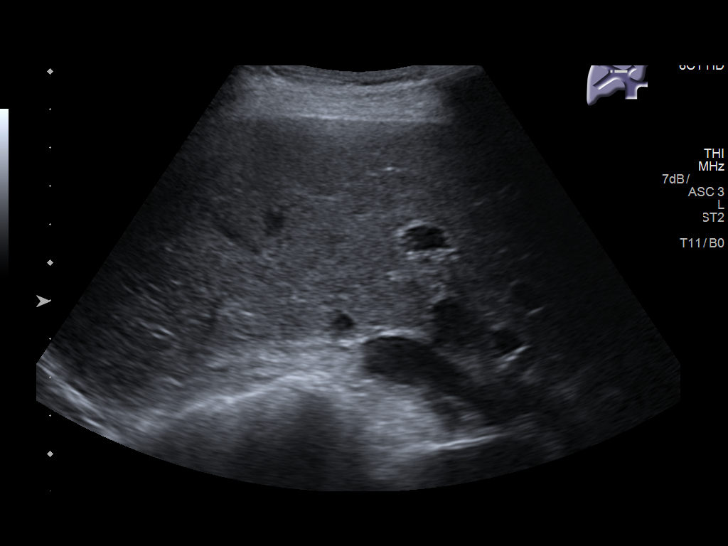
[im 57/124]
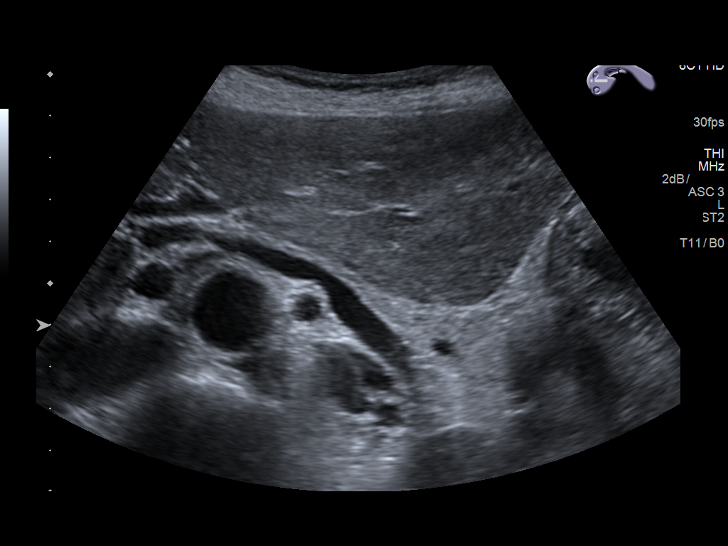
[im 67/124]
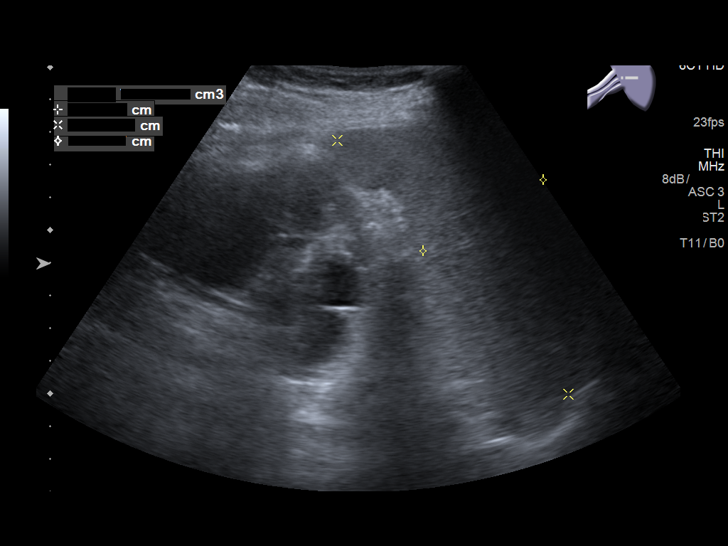
[im 77/124]
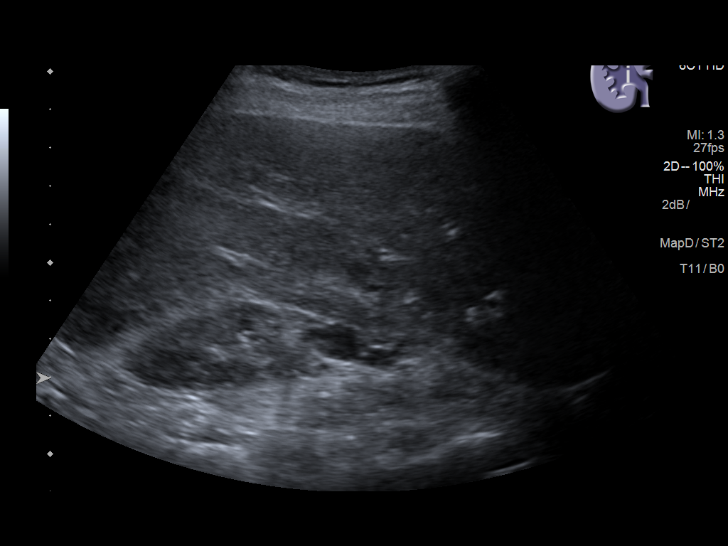
[im 83/124]
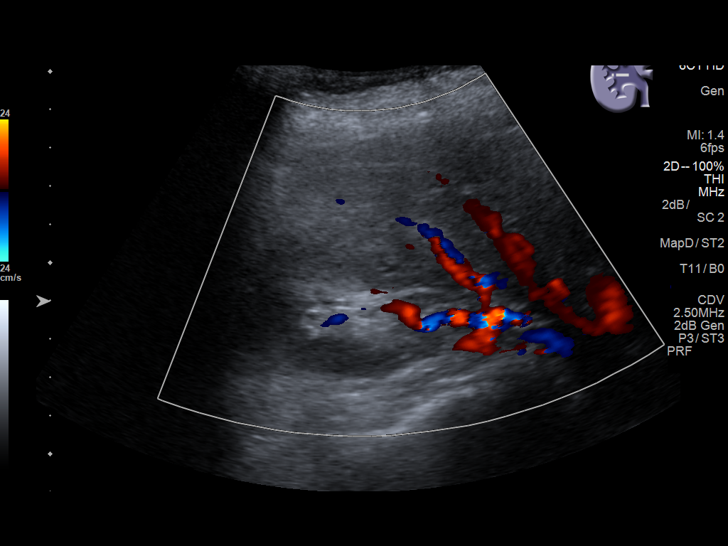
[im 93/124]
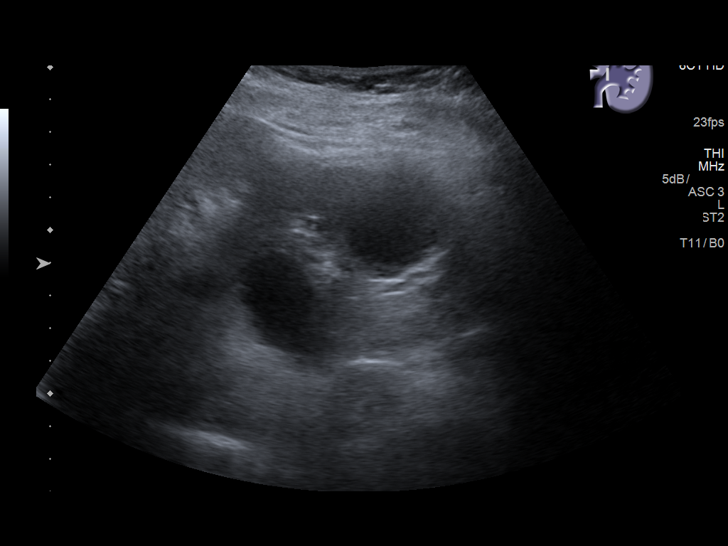
[im 103/124]
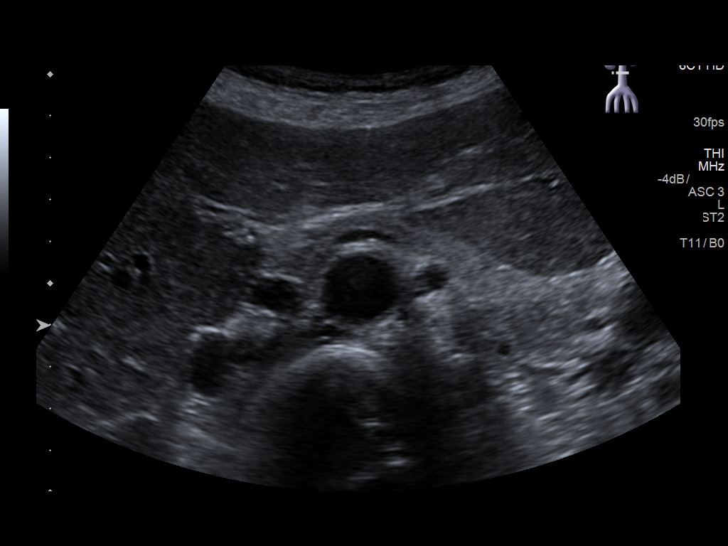
[im 113/124]
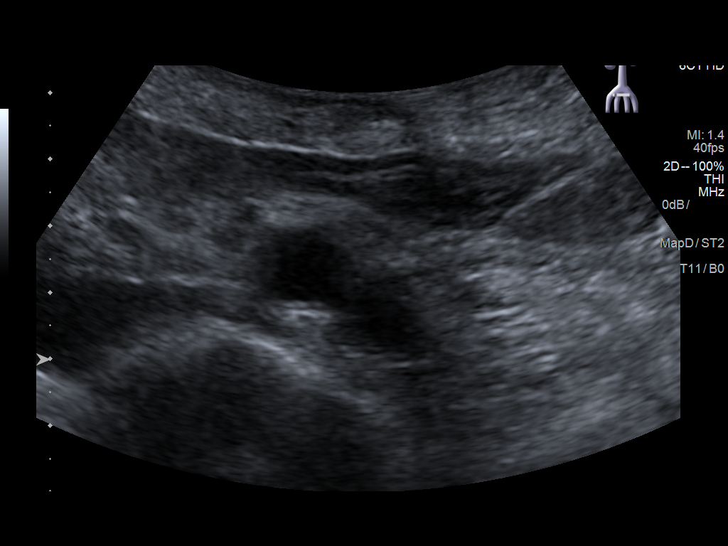
[im 124/124]
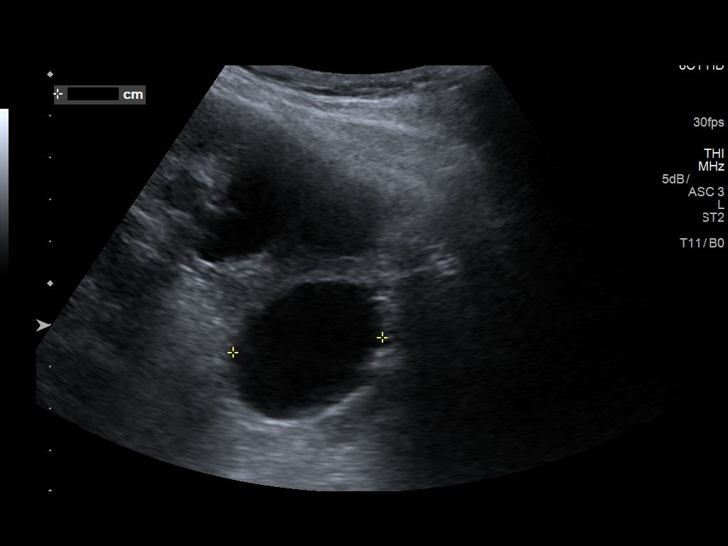

[14 of 25 positions shown; findings below may reference images not displayed]

FINDINGS: Gallbladder: Surgically absent.

Common bile duct: Diameter: 7 mm

Liver: No focal lesion identified. Within normal limits in
parenchymal echogenicity. Portal vein is patent on color Doppler
imaging with normal direction of blood flow towards the liver.

IVC: No abnormality visualized.

Pancreas: Visualized portion unremarkable.

Spleen: Size and appearance within normal limits.

Right Kidney: Length: 11.3 cm. Echogenicity within normal limits. No
mass or hydronephrosis visualized. Lower pole cysts measure 0.8 x
0.7 x 0.6 cm. Mild pelviectasis.

Left Kidney: Length: 12.4 cm. Echogenicity within normal limits. No
mass or hydronephrosis visualized. There are 2 relatively large
renal cysts. At the lower pole, a cyst measures 4.2 x 2.9 x 3.6 cm.
In the interpolar region, the cyst measures 4.4 x 4 5 x 5.4 cm.

Abdominal aorta: No aneurysm visualized.  Mild atherosclerosis.

Other findings: None.
IMPRESSION: 1. No acute abdominal abnormality.
2.  Aortic Atherosclerosis ([id number]).

## 2020-03-02 IMAGING — CT CT TEMPORAL BONES W/O CM
2 of 7 series · 13 of 40 positions shown, 16 images · non-contrast
Comparison: No pertinent prior studies available for comparison.

CLINICAL DATA: otalgia, right. Additional history provided: Patient
reports having right ear pain for the past 4-5 months, balance
issues as well.

EXAM:
CT TEMPORAL BONES WITHOUT CONTRAST
TECHNIQUE: Axial and coronal plane CT imaging of the petrous temporal bones was
performed with thin-collimation image reconstruction. No intravenous
contrast was administered. Multiplanar CT image reconstructions were
also generated.

[Series 8: cor bone temperal bones · coronal · 0.18mm/px · 2 of 212 slices shown]
[im 71/212  bone]
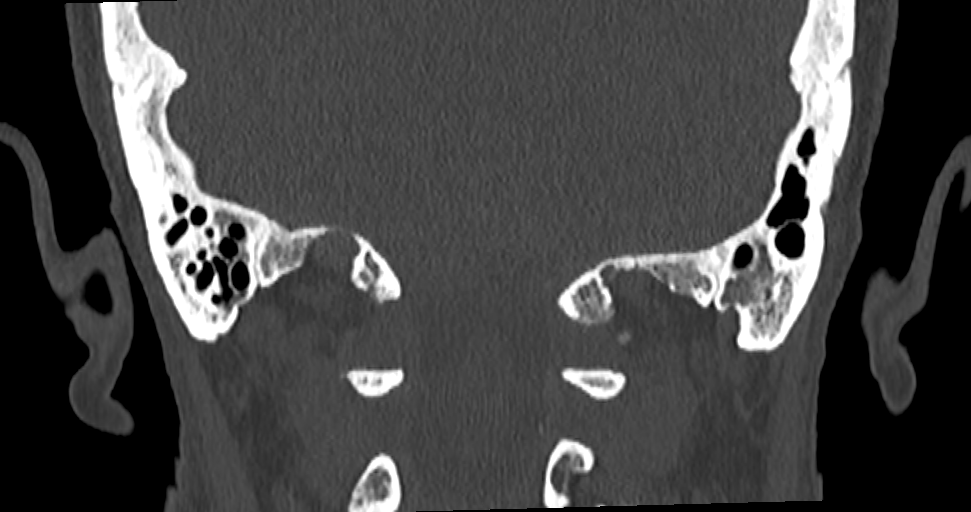
[im 141/212  bone]
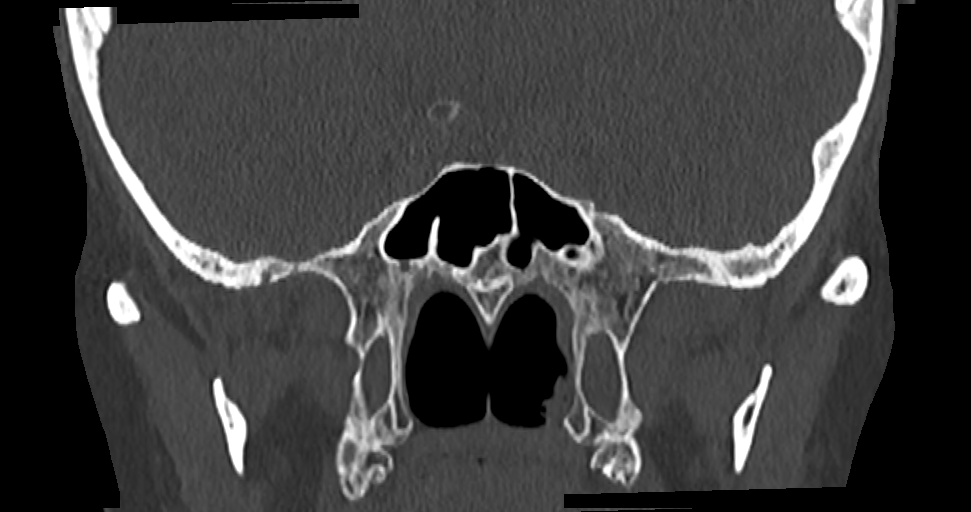

[Series 10: ax mag right temperal bones · axial · 0.20mm/px · z∈[-600,-526]mm · 11 of 149 slices shown, 14 images]
[im 13/149  brain]
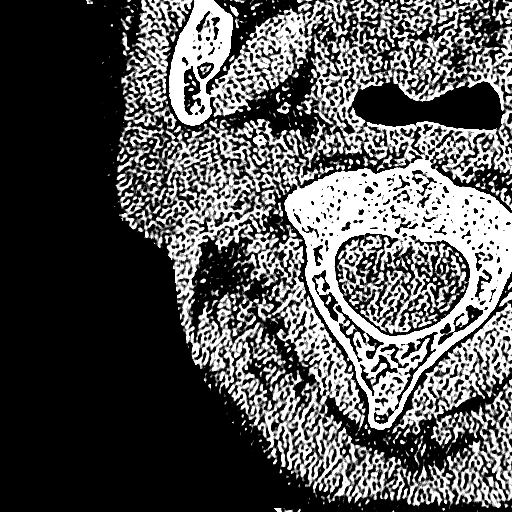
[im 13/149  bone]
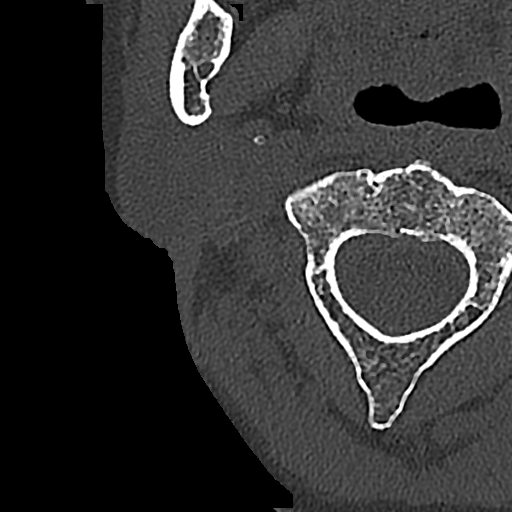
[im 25/149  bone]
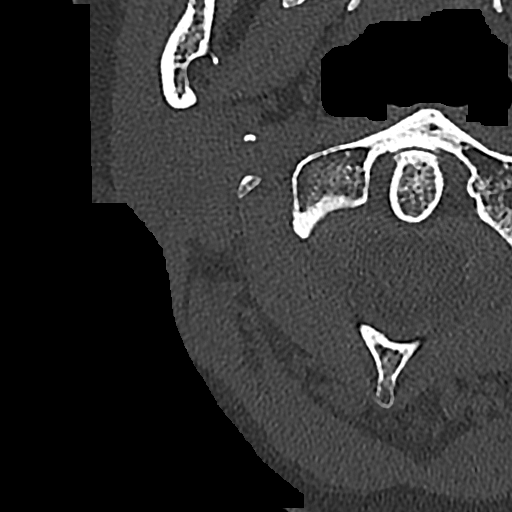
[im 38/149  bone]
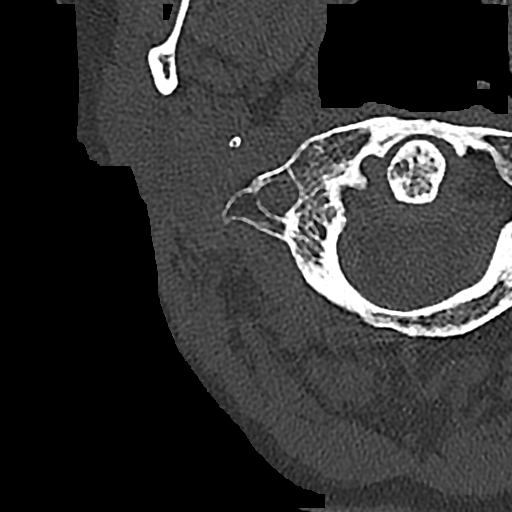
[im 50/149  bone]
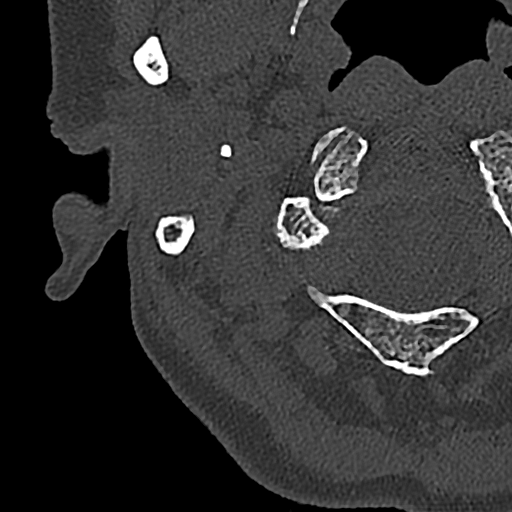
[im 62/149  brain]
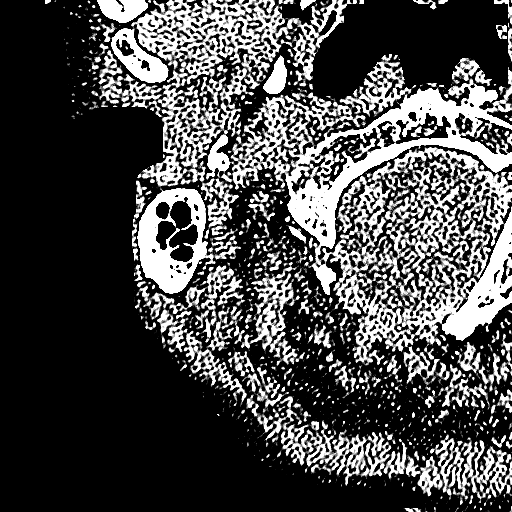
[im 62/149  bone]
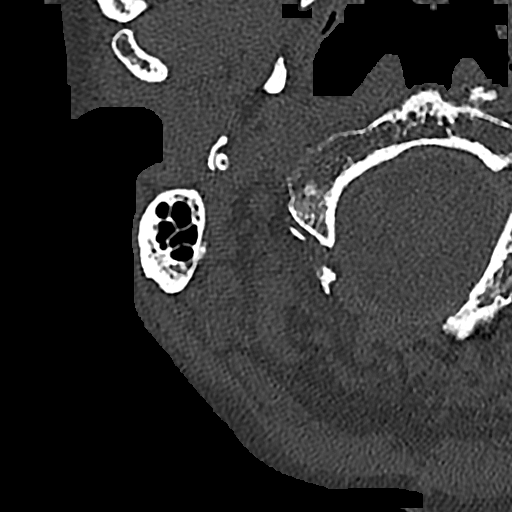
[im 75/149  bone]
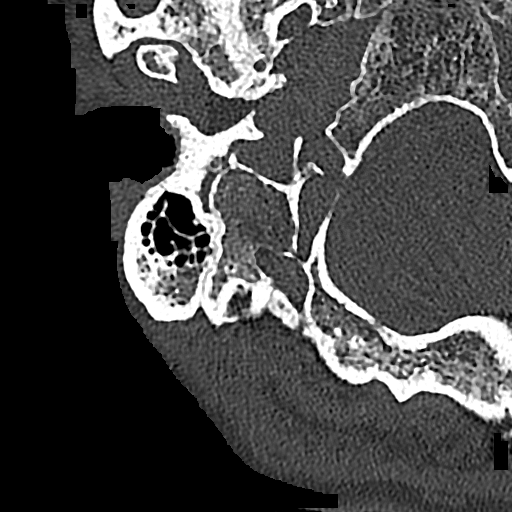
[im 87/149  bone]
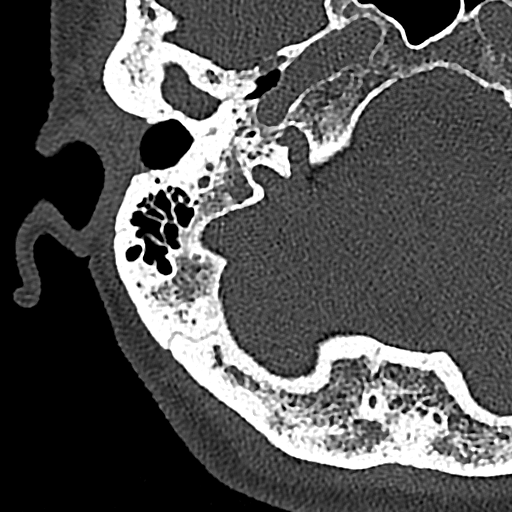
[im 99/149  bone]
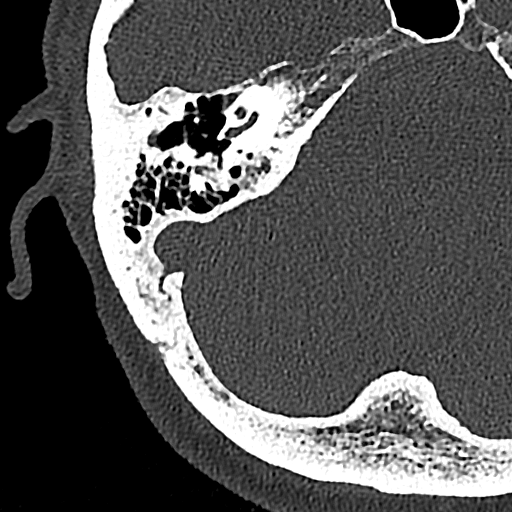
[im 112/149  brain]
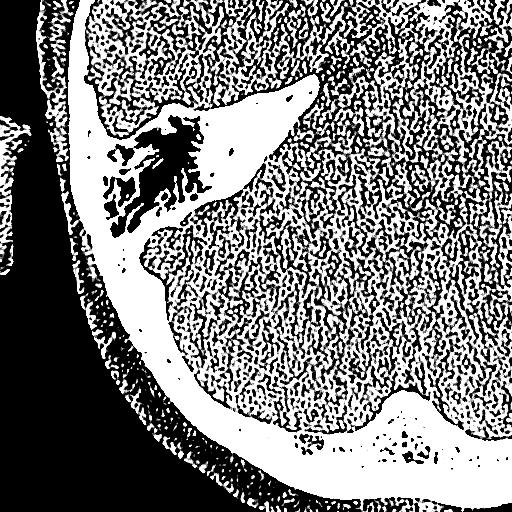
[im 112/149  bone]
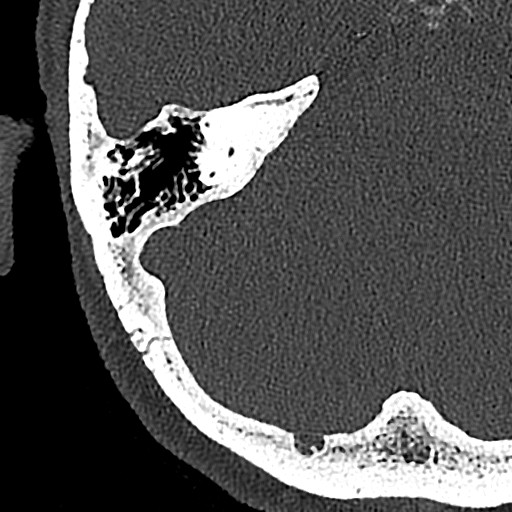
[im 124/149  bone]
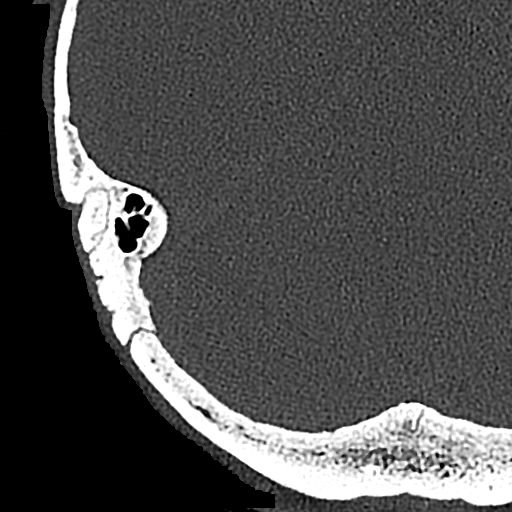
[im 136/149  bone]
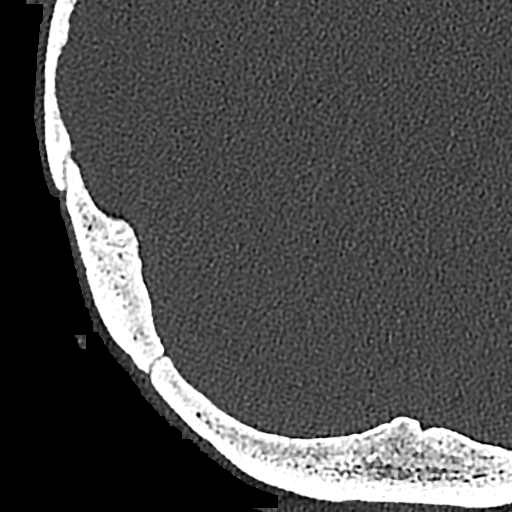

[13 of 40 positions shown; findings below may reference images not displayed]

FINDINGS: RIGHT:

The external auditory canal is patent. The middle ear cavity is well
aerated. The ossicles are unremarkable. The inner ear structures,
internal auditory and facial nerve canals are normal. The mastoid
air cells are well aerated

LEFT:

There is minimal debris within the left external auditory canal. The
middle ear cavity is well aerated. The ossicles are unremarkable.
The inner ear structures, internal auditory and facial nerve canals
are normal. The mastoid air cells are well aerated.

The orbits are unremarkable. Postsurgical appearance of the
paranasal sinuses. Minimal ethmoid sinus mucosal thickening. Mild
mucosal thickening within the imaged maxillary sinuses.
IMPRESSION: Unremarkable CT of the temporal bones as detailed.

Postsurgical appearance of the paranasal sinuses with mild paranasal
sinus mucosal thickening.

## 2020-07-05 ENCOUNTER — Ambulatory Visit: Payer: Medicare Other | Admitting: Gastroenterology

## 2020-07-05 ENCOUNTER — Other Ambulatory Visit: Payer: Self-pay

## 2020-07-05 ENCOUNTER — Encounter: Payer: Self-pay | Admitting: Gastroenterology

## 2020-07-05 ENCOUNTER — Ambulatory Visit (INDEPENDENT_AMBULATORY_CARE_PROVIDER_SITE_OTHER): Payer: Medicare Other | Admitting: Gastroenterology

## 2020-07-05 VITALS — BP 145/81 | HR 77 | Temp 98.2°F | Ht 69.0 in | Wt 154.1 lb

## 2020-07-05 DIAGNOSIS — K644 Residual hemorrhoidal skin tags: Secondary | ICD-10-CM | POA: Diagnosis not present

## 2020-07-05 DIAGNOSIS — J45909 Unspecified asthma, uncomplicated: Secondary | ICD-10-CM | POA: Insufficient documentation

## 2020-07-05 DIAGNOSIS — F32A Depression, unspecified: Secondary | ICD-10-CM | POA: Insufficient documentation

## 2020-07-05 DIAGNOSIS — E785 Hyperlipidemia, unspecified: Secondary | ICD-10-CM | POA: Insufficient documentation

## 2020-07-05 NOTE — Patient Instructions (Signed)

## 2020-07-05 NOTE — Progress Notes (Signed)
Cephas Darby, MD [address]  [place], [place] [address]  Main: [phone]  Fax: [phone]    Gastroenterology Consultation  Referring Provider:     Tracie Harrier, MD Primary Care Physician:  Tracie Harrier, MD Primary Gastroenterologist:  Dr. Cephas Darby Reason for Consultation:     Hemorrhoids        HPI:   Tanya Snyder is a 69 y.o. female referred by Dr. Tracie Harrier, MD  for consultation & management of hemorrhoids.  Patient reports that she has noticed external hemorrhoid since her pregnancy at age 69.  Her symptoms are primarily intermittent rectal bleeding and prolapse of the hemorrhoid that she has to push inside.  She reports increased size of the prolapsed tissue during bowel movement.  She denies rectal pain, discomfort, itching, leakage.  She does report lower abdominal bloating.  She does report irregular bowel habits and tries to avoid bread.  She takes MiraLAX and fiber supplement as needed for constipation.  She denies any weight loss.  She would like to know if the hemorrhoid can be removed.  She does not have anemia.  NSAIDs: None  Antiplts/Anticoagulants/Anti thrombotics: None  GI Procedures: Surveillance colonoscopy 09/01/2015 by Dr. Rayann Heman for personal history of colon polyp - Non-thrombosed external hemorrhoids found on perianal exam. - The entire examined colon is normal on direct and retroflexion views. - No specimens collected.  Past Medical History:  Diagnosis Date  . Asthma   . Breast cancer ([place])   . Cancer ([place])   . Depression   . History of chicken pox   . Hyperlipemia   . Hyperthyroidism     Past Surgical History:  Procedure Laterality Date  . ABDOMINAL HYSTERECTOMY    . COLONOSCOPY WITH PROPOFOL N/A 09/01/2015   Procedure: COLONOSCOPY WITH PROPOFOL;  Surgeon: Josefine Class, MD;  Location: [facility] ENDOSCOPY;  Service: Endoscopy;  Laterality: N/A;  . MASTECTOMY    . SPINAL FUSION       Current Outpatient Medications:  .  albuterol (VENTOLIN HFA) 108 (90 Base) MCG/ACT inhaler, SMARTSIG:1-2 Puff(s) Via Inhaler Every 6 Hours PRN, Disp: , Rfl:  .  aluminum hydroxide-magnesium carbonate (GAVISCON) 95-358 MG/15ML SUSP, Take 15 mLs by mouth., Disp: , Rfl:  .  ascorbic acid (VITAMIN C) 1000 MG tablet, Take 1,000 mg by mouth daily., Disp: , Rfl:  .  calcium carbonate (TUMS - DOSED IN MG ELEMENTAL CALCIUM) 500 MG chewable tablet, Chew 1 tablet by mouth daily., Disp: , Rfl:  .  clobetasol ointment (TEMOVATE) 0.05 %, Apply topically daily., Disp: , Rfl:  .  clonazePAM (KLONOPIN) 0.5 MG tablet, Take 0.5 mg by mouth 2 (two) times daily as needed for anxiety., Disp: , Rfl:  .  cyclobenzaprine (FLEXERIL) 5 MG tablet, Take 5 mg by mouth at bedtime as needed., Disp: , Rfl:  .  D-MANNOSE PO, Take by mouth., Disp: , Rfl:  .  diphenhydrAMINE (BENADRYL) 25 MG tablet, Take 25 mg by mouth every 6 (six) hours as needed., Disp: , Rfl:  .  EPINEPHrine 0.3 mg/0.3 mL IJ SOAJ injection, Inject into the muscle as directed., Disp: , Rfl:  .  ezetimibe (ZETIA) 10 MG tablet, Take 10 mg by mouth daily., Disp: , Rfl:  .  hydrOXYzine (ATARAX/VISTARIL) 25 MG tablet, Take 25 mg by mouth at bedtime as needed., Disp: , Rfl:  .  levothyroxine (SYNTHROID) 125 MCG tablet, Take 125 mcg by mouth daily., Disp: , Rfl:  .  Methenamine-Sodium  Salicylate (CYSTEX) 324-401.0 MG TABS, Take by mouth., Disp: , Rfl:  .  montelukast (SINGULAIR) 10 MG tablet, Take by mouth., Disp: , Rfl:  .  nitrofurantoin (MACRODANTIN) 50 MG capsule, Take by mouth., Disp: , Rfl:  .  oxyCODONE-acetaminophen (PERCOCET/ROXICET) 5-325 MG per tablet, Take 1 tablet by mouth every 4 (four) hours as needed for severe pain., Disp: , Rfl:  .  ramelteon (ROZEREM) 8 MG tablet, Take by mouth., Disp: , Rfl:  .  triamcinolone (NASACORT) 55 MCG/ACT AERO nasal inhaler, Place into the nose., Disp: , Rfl:  .  triamcinolone cream (KENALOG) 0.5 %, Apply topically 2  (two) times daily., Disp: , Rfl:  .  VYVANSE 40 MG capsule, Take 40 mg by mouth at bedtime., Disp: , Rfl:  .  WIXELA INHUB 250-50 MCG/DOSE AEPB, 1 puff 2 (two) times daily., Disp: , Rfl:  .  zolpidem (AMBIEN) 5 MG tablet, Take 5 mg by mouth at bedtime as needed., Disp: , Rfl:    History reviewed. No pertinent family history.   Social History   Tobacco Use  . Smoking status: Never Smoker  . Smokeless tobacco: Never Used  Substance Use Topics  . Alcohol use: No    Alcohol/week: 0.0 standard drinks  . Drug use: Not on file    Allergies as of 07/05/2020 - Review Complete 07/05/2020  Allergen Reaction Noted  . Acetaminophen Nausea Only 09/18/2006  . Hydrocodone-acetaminophen Other (See Comments) 11/30/2019    Review of Systems:    All systems reviewed and negative except where noted in HPI.   Physical Exam:  BP (!) 145/81 (BP Location: Left Arm, Patient Position: Sitting, Cuff Size: Normal)   Pulse 77   Temp 98.2 F (36.8 C) (Oral)   Ht 5\' 9"  (1.753 m)   Wt 154 lb 2 oz (69.9 kg)   BMI 22.76 kg/m  No LMP recorded. Patient has had a hysterectomy.  General:   Alert,  Well-developed, well-nourished, pleasant and cooperative in NAD Head:  Normocephalic and atraumatic. Eyes:  Sclera clear, no icterus.   Conjunctiva pink. Ears:  Normal auditory acuity. Nose:  No deformity, discharge, or lesions. Mouth:  No deformity or lesions,oropharynx pink & moist. Neck:  Supple; no masses or thyromegaly. Lungs:  Respirations even and unlabored.  Clear throughout to auscultation.   No wheezes, crackles, or rhonchi. No acute distress. Heart:  Regular rate and rhythm; no murmurs, clicks, rubs, or gallops. Abdomen:  Normal bowel sounds. Soft, non-tender and mildly distended, tympanic to percussion without masses, hepatosplenomegaly or hernias noted.  No guarding or rebound tenderness.   Rectal: Large perianal skin tag extending from right anterior external hemorrhoid, nontender,  erythematous Msk:  Symmetrical without gross deformities. Good, equal movement & strength bilaterally. Pulses:  Normal pulses noted. Extremities:  No clubbing or edema.  No cyanosis. Neurologic:  Alert and oriented x3;  grossly normal neurologically. Skin:  Intact without significant lesions or rashes. No jaundice. Psych:  Alert and cooperative. Normal mood and affect.  Imaging Studies: Reviewed  Assessment and Plan:   DEMIKA LANGENDERFER is a 69 y.o. female who is a breast cancer survivor, chronic constipation is seen in consultation for symptomatic hemorrhoids  Chronic constipation Discussed with her about adequate intake of water, fiber supplements and stool softener, information provided  Symptomatic hemorrhoid Perianal exam reveals large perianal skin tag rather than an external hemorrhoid Discussed with her about surgical excision.  However, patient reports that her symptoms are manageable and would like to defer removal of  skin tag at this time There is no role for banding at this time as her symptoms are on minimal  Personal history of colon polyp Patient is overdue for colonoscopy.  However, patient prefers to have colonoscopy performed by Dr. Allen Norris as she said, she was told by her PCP that Dr. Allen Norris is experienced in performing colonoscopies  Follow up as needed   Cephas Darby, MD

## 2020-07-05 NOTE — Progress Notes (Signed)
error 

## 2020-08-23 ENCOUNTER — Emergency Department: Payer: Medicare Other

## 2020-08-23 ENCOUNTER — Other Ambulatory Visit: Payer: Self-pay

## 2020-08-23 ENCOUNTER — Emergency Department
Admission: EM | Admit: 2020-08-23 | Discharge: 2020-08-23 | Disposition: A | Discharge status: Home / Self Care | Payer: Medicare Other | Attending: Emergency Medicine | Admitting: Emergency Medicine

## 2020-08-23 DIAGNOSIS — Z5321 Procedure and treatment not carried out due to patient leaving prior to being seen by health care provider: Secondary | ICD-10-CM | POA: Insufficient documentation

## 2020-08-23 DIAGNOSIS — Y9389 Activity, other specified: Secondary | ICD-10-CM | POA: Diagnosis not present

## 2020-08-23 DIAGNOSIS — M79672 Pain in left foot: Secondary | ICD-10-CM | POA: Insufficient documentation

## 2020-08-23 DIAGNOSIS — Y999 Unspecified external cause status: Secondary | ICD-10-CM | POA: Diagnosis not present

## 2020-08-23 DIAGNOSIS — X58XXXA Exposure to other specified factors, initial encounter: Secondary | ICD-10-CM | POA: Diagnosis not present

## 2020-08-23 DIAGNOSIS — Y92810 Car as the place of occurrence of the external cause: Secondary | ICD-10-CM | POA: Insufficient documentation

## 2020-08-23 NOTE — ED Triage Notes (Signed)
Pt complains of left foot pain after stepping on emergency brake of car. Pt without obvious injury. Cms intact.

## 2020-09-08 ENCOUNTER — Ambulatory Visit: Payer: Medicare Other | Admitting: Gastroenterology

## 2020-10-25 DIAGNOSIS — R002 Palpitations: Secondary | ICD-10-CM | POA: Insufficient documentation

## 2020-11-04 ENCOUNTER — Other Ambulatory Visit: Payer: Self-pay

## 2020-11-08 ENCOUNTER — Encounter: Payer: Self-pay | Admitting: Gastroenterology

## 2020-11-08 ENCOUNTER — Other Ambulatory Visit: Payer: Self-pay

## 2020-11-08 ENCOUNTER — Ambulatory Visit: Payer: Medicare Other | Admitting: Gastroenterology

## 2020-11-08 VITALS — BP 134/80 | HR 70 | Temp 97.6°F | Wt 157.5 lb

## 2020-11-08 DIAGNOSIS — Z86010 Personal history of colonic polyps: Secondary | ICD-10-CM

## 2020-11-08 MED ORDER — NA SULFATE-K SULFATE-MG SULF 17.5-3.13-1.6 GM/177ML PO SOLN: 354.0000 mL | Freq: Once | ORAL | 0 refills | Status: AC

## 2020-11-08 NOTE — Progress Notes (Unsigned)
supr

## 2020-11-08 NOTE — Progress Notes (Signed)
Primary Care Physician: Tracie Harrier, MD  Primary Gastroenterologist:  Dr. Lucilla Lame  Chief Complaint  Patient presents with  . Follow-up    discuss procedure     HPI: Tanya Snyder is a 69 y.o. female here with a history of colon polyps.  The patient also has a history of gastritis.  The patient was seen by Dr. Marius Ditch and her note was reviewed. The patient want to switch over to my care due to recommendations.  The patient had a colonoscopy by Dr. Rayann Heman in 2016 with a history of adenomatous polyps. The patient also had gastritis on upper endoscopy in 2012. The patient was put on Nexium in the past but does not take it on a regular basis.  She states that she has anywhere from 4-6 episodes of severe abdominal pain that usually last 2-3 days and she treated with antacids or taking a PPI.  She denies any unexplained weight loss fevers chills nausea or vomiting at this time.  The patient has had back surgery for scoliosis.  She states that the surgery was in 1965.  Her last EGD also recommended no further EGDs unless she was having any new problems.  Past Medical History:  Diagnosis Date  . Asthma   . Breast cancer ([place])   . Cancer ([place])   . Depression   . History of chicken pox   . Hyperlipemia   . Hyperthyroidism     Current Outpatient Medications  Medication Sig Dispense Refill  . albuterol (VENTOLIN HFA) 108 (90 Base) MCG/ACT inhaler SMARTSIG:1-2 Puff(s) Via Inhaler Every 6 Hours PRN    . aluminum hydroxide-magnesium carbonate (GAVISCON) 95-358 MG/15ML SUSP Take 15 mLs by mouth.    Marland Kitchen amphetamine-dextroamphetamine (ADDERALL) 10 MG tablet Take 10 mg by mouth every morning.    Marland Kitchen ascorbic acid (VITAMIN C) 1000 MG tablet Take 1,000 mg by mouth daily.    . calcium carbonate (TUMS - DOSED IN MG ELEMENTAL CALCIUM) 500 MG chewable tablet Chew 1 tablet by mouth daily.    . clobetasol ointment (TEMOVATE) 0.05 % Apply topically daily.    . clonazePAM (KLONOPIN) 0.5 MG tablet  Take 0.5 mg by mouth 2 (two) times daily as needed for anxiety.    . cyclobenzaprine (FLEXERIL) 5 MG tablet Take 5 mg by mouth at bedtime as needed.    . D-MANNOSE PO Take by mouth.    . diphenhydrAMINE (BENADRYL) 25 MG tablet Take 25 mg by mouth every 6 (six) hours as needed.    Marland Kitchen EPINEPHrine 0.3 mg/0.3 mL IJ SOAJ injection Inject into the muscle as directed.    . ezetimibe (ZETIA) 10 MG tablet Take 10 mg by mouth daily.    . hydrOXYzine (ATARAX/VISTARIL) 25 MG tablet Take 25 mg by mouth at bedtime as needed.    Marland Kitchen levothyroxine (SYNTHROID) 125 MCG tablet Take 125 mcg by mouth daily.    . Methenamine-Sodium Salicylate (CYSTEX) 465-035.4 MG TABS Take by mouth.    . montelukast (SINGULAIR) 10 MG tablet Take by mouth.    . oxyCODONE-acetaminophen (PERCOCET/ROXICET) 5-325 MG per tablet Take 1 tablet by mouth every 4 (four) hours as needed for severe pain.    Marland Kitchen triamcinolone (NASACORT) 55 MCG/ACT AERO nasal inhaler Place into the nose.    . triamcinolone cream (KENALOG) 0.5 % Apply topically 2 (two) times daily.    Marland Kitchen VYVANSE 40 MG capsule Take 40 mg by mouth at bedtime.    Grant Ruts INHUB 250-50 MCG/DOSE AEPB 1 puff  2 (two) times daily.    Marland Kitchen zolpidem (AMBIEN) 5 MG tablet Take 5 mg by mouth at bedtime as needed.    . Na Sulfate-K Sulfate-Mg Sulf 17.5-3.13-1.6 GM/177ML SOLN Take 354 mLs by mouth once for 1 dose. 354 mL 0  . ramelteon (ROZEREM) 8 MG tablet Take by mouth.     No current facility-administered medications for this visit.    Allergies as of 11/08/2020 - Review Complete 11/08/2020  Allergen Reaction Noted  . Acetaminophen Nausea Only 09/18/2006  . Hydrocodone-acetaminophen Other (See Comments) 11/30/2019    ROS:  General: Negative for anorexia, weight loss, fever, chills, fatigue, weakness. ENT: Negative for hoarseness, difficulty swallowing , nasal congestion. CV: Negative for chest pain, angina, palpitations, dyspnea on exertion, peripheral edema.  Respiratory: Negative for  dyspnea at rest, dyspnea on exertion, cough, sputum, wheezing.  GI: See history of present illness. GU:  Negative for dysuria, hematuria, urinary incontinence, urinary frequency, nocturnal urination.  Endo: Negative for unusual weight change.    Physical Examination:   BP 134/80 (BP Location: Left Arm, Patient Position: Sitting, Cuff Size: Normal)   Pulse 70   Temp 97.6 F (36.4 C) (Oral)   Wt 157 lb 8 oz (71.4 kg)   BMI 23.26 kg/m   General: Well-nourished, well-developed in no acute distress.  Eyes: No icterus. Conjunctivae pink. Lungs: Clear to auscultation bilaterally. Non-labored. Heart: Regular rate and rhythm, no murmurs rubs or gallops.  Abdomen: Bowel sounds are normal, nontender, nondistended, no hepatosplenomegaly or masses, no abdominal bruits or hernia , no rebound or guarding.   Extremities: No lower extremity edema. No clubbing or deformities. Neuro: Alert and oriented x 3.  Grossly intact. Skin: Warm and dry, no jaundice.   Psych: Alert and cooperative, normal mood and affect.  Labs:    Imaging Studies: No results found.  Assessment and Plan:   Tanya Snyder is a 69 y.o. y/o female Who has a history of polyps and is in need of a colonoscopy.  The patient would like to wait till the beginning of the next year to set it up.  The patient has been told the risks and benefits of PPIs and has been told that if her symptoms are so infrequent and she can deal with them when they happen then she would be better off not being on a medication every day.  The patient will be set up for colonoscopy as described above.  The patient has been explained the plan and agrees with it.     Lucilla Lame, MD. Marval Regal    Note: This dictation was prepared with Dragon dictation along with smaller phrase technology. Any transcriptional errors that result from this process are unintentional.

## 2021-01-19 IMAGING — CR DG FOOT COMPLETE 3+V*L*
1 series · 3 of 3 positions shown · non-contrast
Comparison: 06/15/2015

CLINICAL DATA: Left foot pain

EXAM:
LEFT FOOT - COMPLETE 3+ VIEW

[Series 1: dg foot complete left · 0.14mm/px · 3 of 3 slices shown]
[im 1/3]
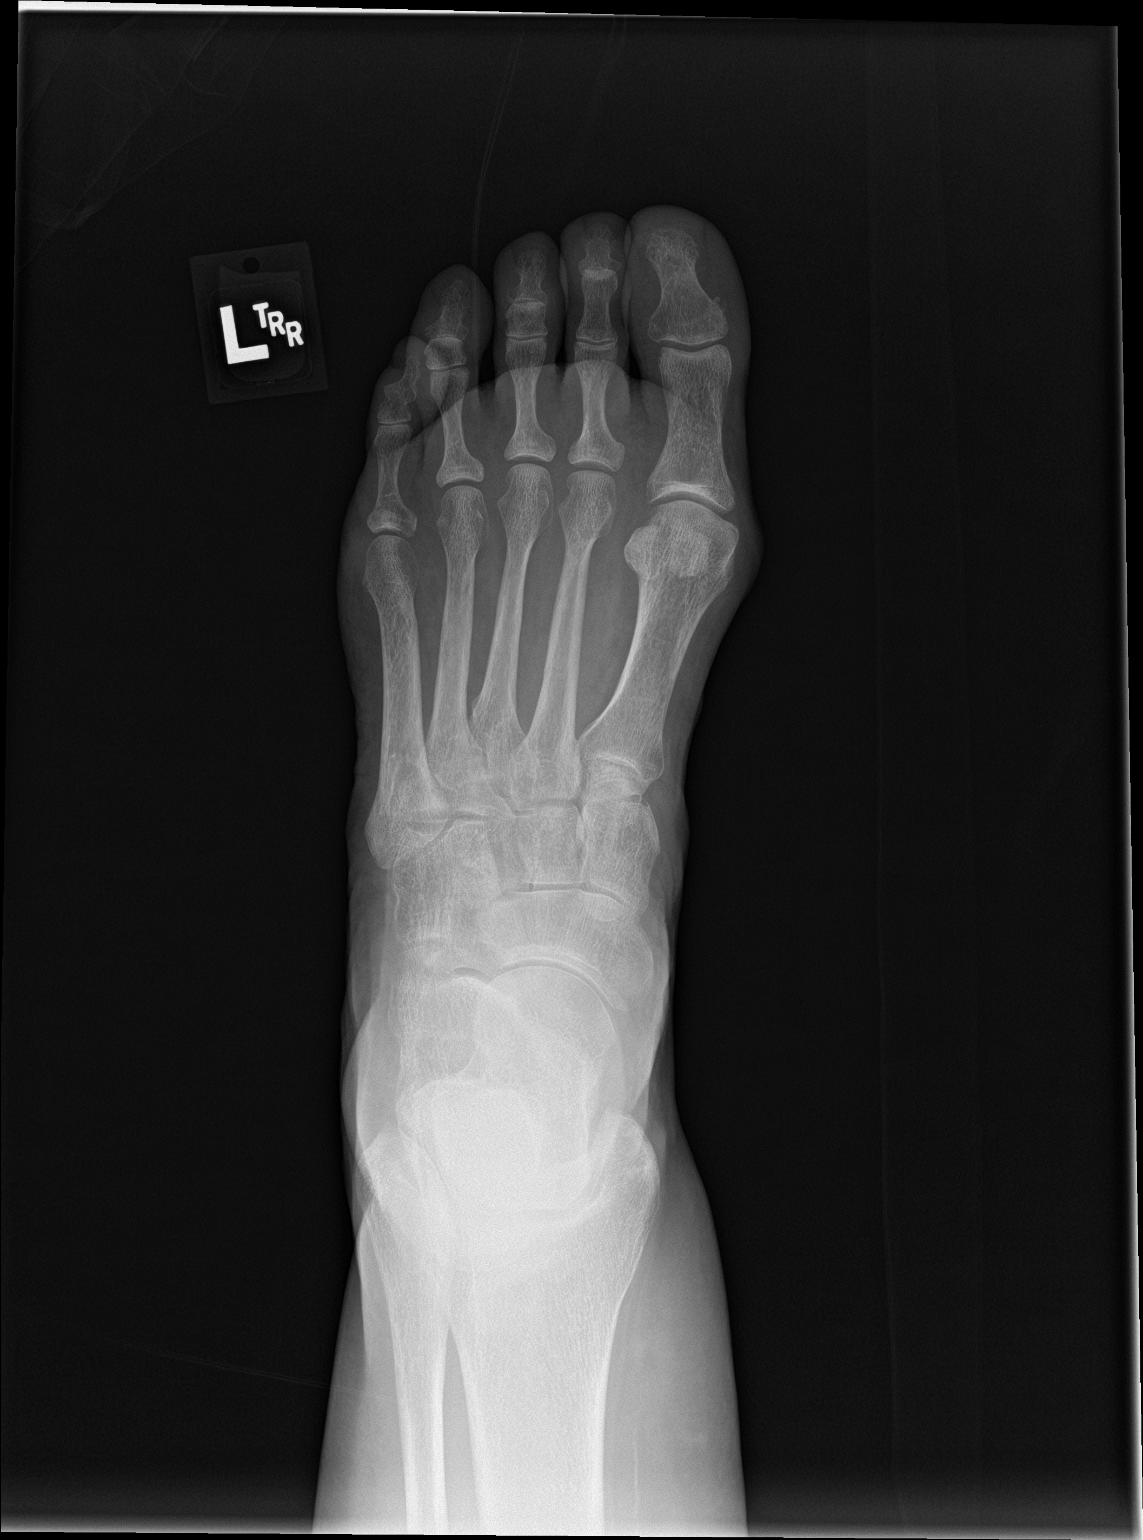
[im 2/3]
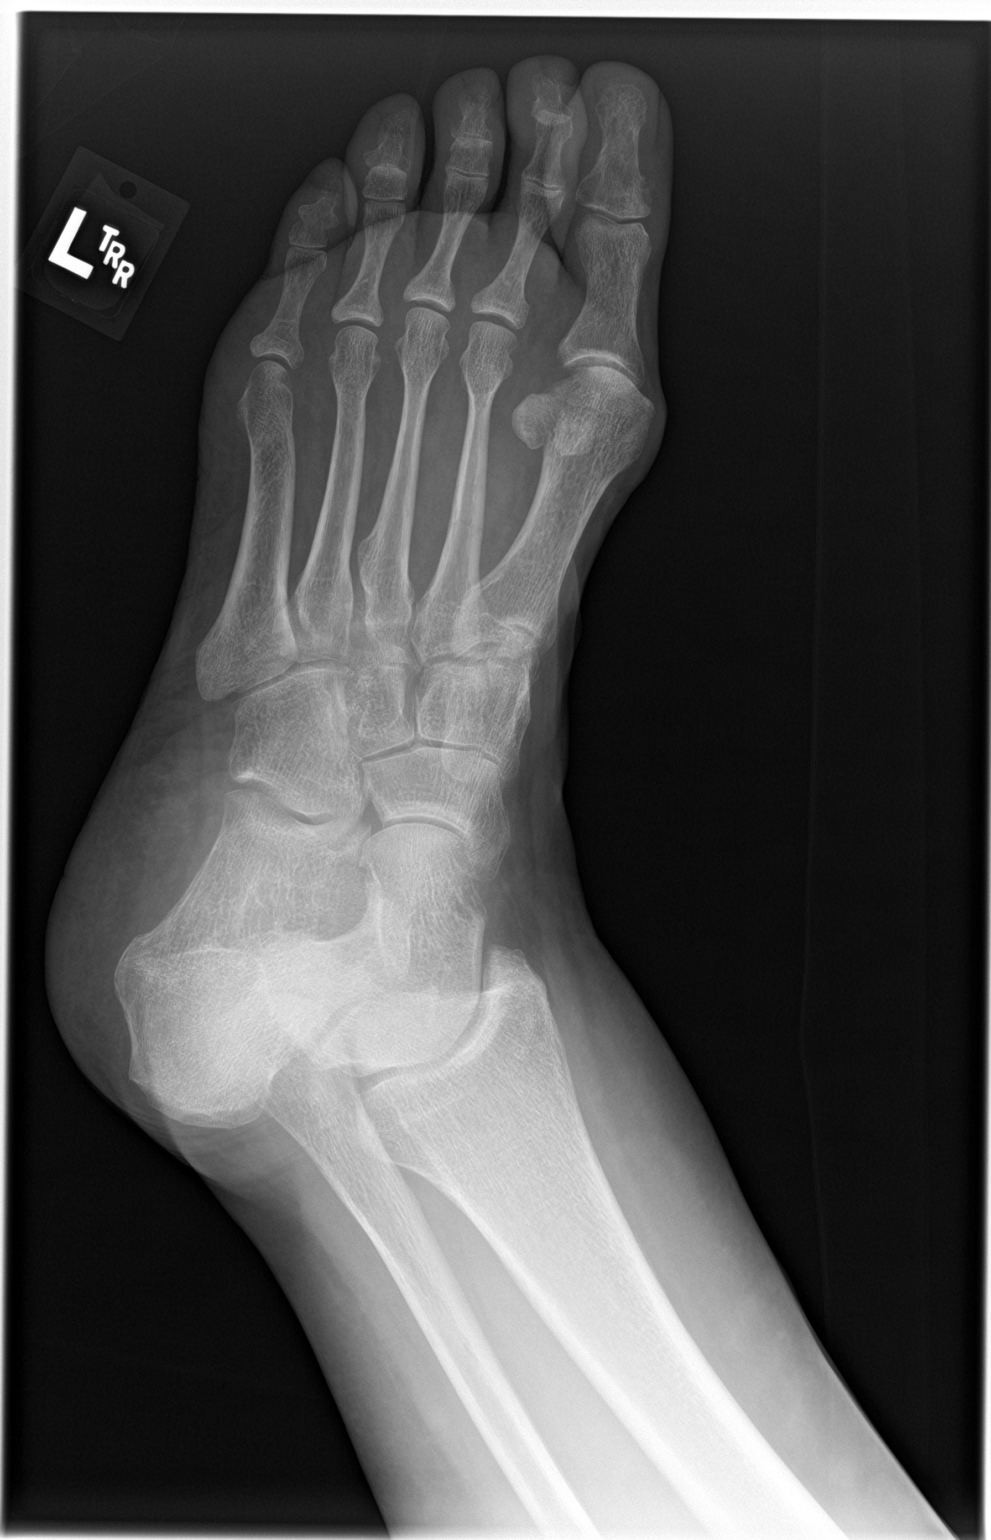
[im 3/3]
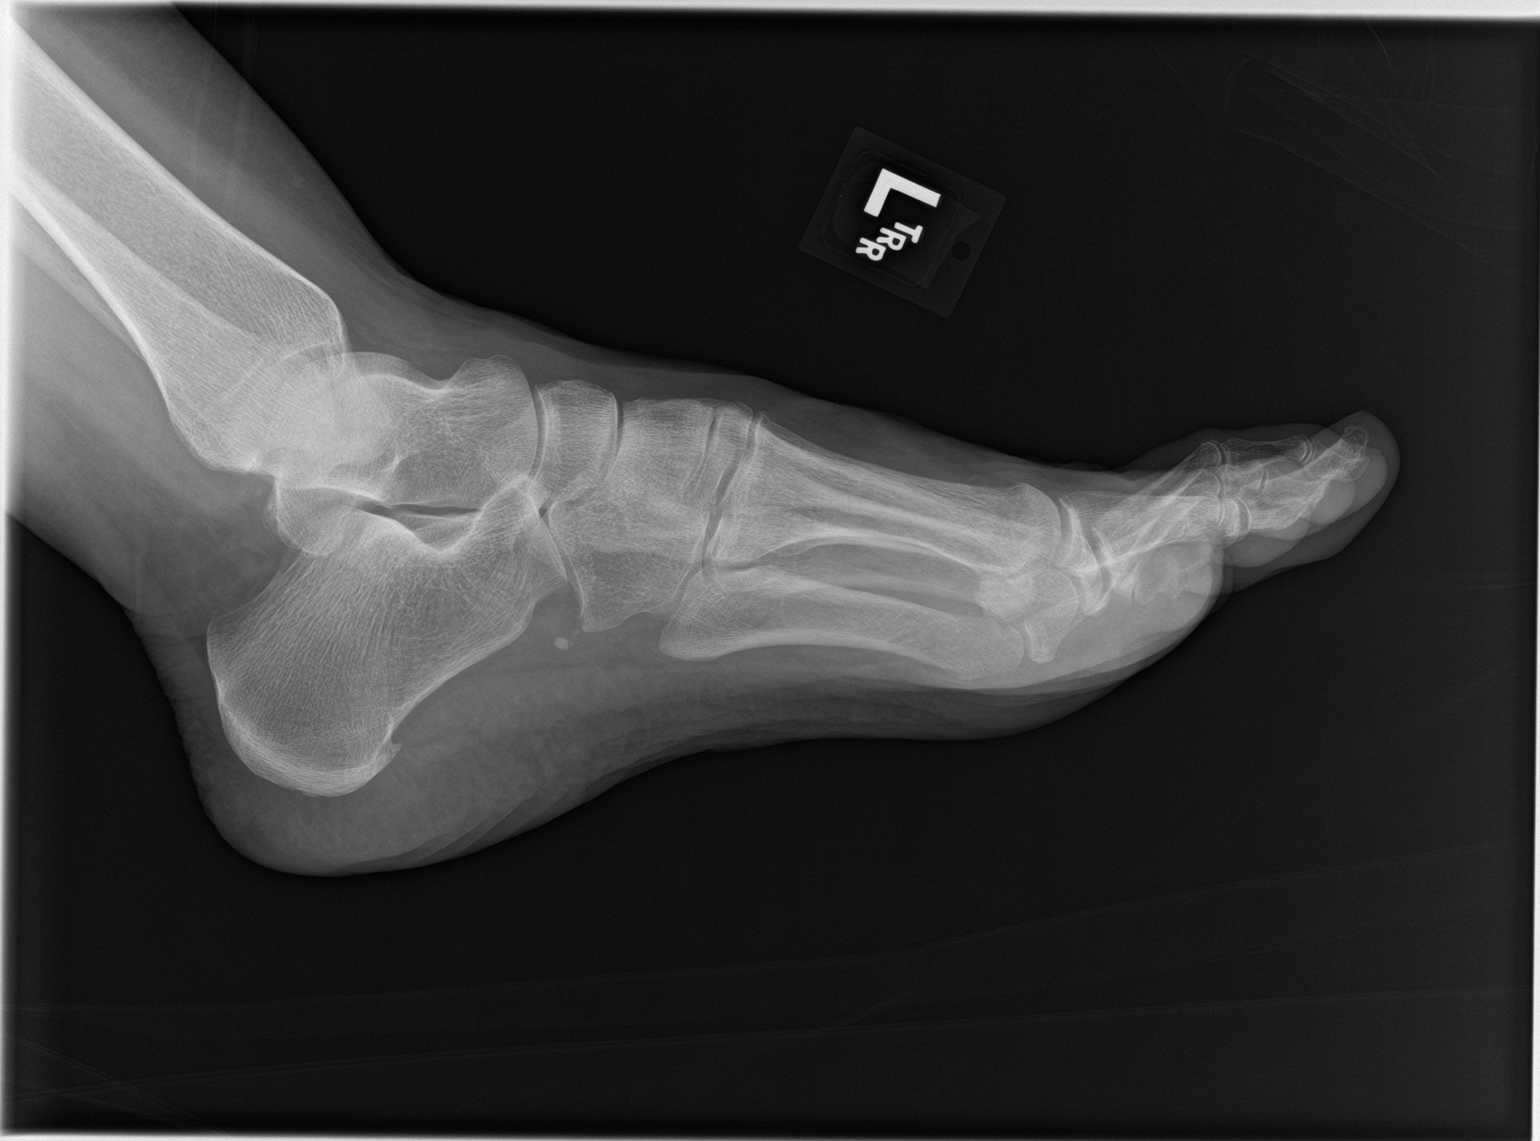

[3 of 3 positions shown; findings below may reference images not displayed]

FINDINGS: Frontal, oblique, and lateral views of the left foot are obtained.
No fracture, subluxation, or dislocation. Joint spaces are well
preserved. Soft tissues are normal.
IMPRESSION: 1. Unremarkable left foot.

## 2021-01-26 ENCOUNTER — Other Ambulatory Visit: Payer: Self-pay

## 2021-01-26 DIAGNOSIS — Z86010 Personal history of colonic polyps: Secondary | ICD-10-CM

## 2021-01-26 NOTE — Progress Notes (Signed)
Pt's colonoscopy has been rescheduled from 02/28/21 to 03/21/21 with Dr. Vicente Males per patients request.  New instructions have been sent via mychart.  Referral updated.  Endoscopy will be notified of date change tomorrow.  Thanks,  [place], [place]

## 2021-02-07 DIAGNOSIS — J4530 Mild persistent asthma, uncomplicated: Secondary | ICD-10-CM | POA: Insufficient documentation

## 2021-02-07 DIAGNOSIS — J309 Allergic rhinitis, unspecified: Secondary | ICD-10-CM | POA: Insufficient documentation

## 2021-02-14 DIAGNOSIS — G629 Polyneuropathy, unspecified: Secondary | ICD-10-CM | POA: Insufficient documentation

## 2021-03-14 ENCOUNTER — Telehealth: Payer: Self-pay | Admitting: Gastroenterology

## 2021-03-14 ENCOUNTER — Other Ambulatory Visit: Payer: Self-pay

## 2021-03-14 MED ORDER — CLENPIQ 10-3.5-12 MG-GM -GM/160ML PO SOLN: 320.0000 mL | ORAL | 0 refills | Status: DC

## 2021-03-14 NOTE — Telephone Encounter (Signed)
Patient has questions regarding procedure and asks for call back.  Procedure scheduled for 03/21/21.

## 2021-03-14 NOTE — Telephone Encounter (Signed)
Returned pt's call regarding question about her upcoming colonoscopy. Discussed medication and food choices and bowel prep.

## 2021-03-17 ENCOUNTER — Other Ambulatory Visit: Payer: Self-pay

## 2021-03-17 ENCOUNTER — Other Ambulatory Visit
Admission: RE | Admit: 2021-03-17 | Discharge: 2021-03-17 | Disposition: A | Discharge status: Home / Self Care | Payer: Medicare Other | Source: Ambulatory Visit | Attending: Gastroenterology | Admitting: Gastroenterology

## 2021-03-17 DIAGNOSIS — Z01812 Encounter for preprocedural laboratory examination: Secondary | ICD-10-CM | POA: Insufficient documentation

## 2021-03-17 DIAGNOSIS — Z20822 Contact with and (suspected) exposure to covid-19: Secondary | ICD-10-CM | POA: Insufficient documentation

## 2021-03-17 LAB — SARS CORONAVIRUS 2 (TAT 6-24 HRS): SARS Coronavirus 2: NEGATIVE

## 2021-03-20 ENCOUNTER — Telehealth: Payer: Self-pay

## 2021-03-20 NOTE — Telephone Encounter (Signed)
LVM for pt to return my call regarding a message about her arrival time for her procedure tomorrow, 03/21/21. Advised pt that Endo department has final say of arrival times, mine is only an estimated time.

## 2021-03-21 ENCOUNTER — Ambulatory Visit
Admission: RE | Admit: 2021-03-21 | Discharge: 2021-03-21 | Disposition: A | Discharge status: Home / Self Care | Payer: Medicare Other | Attending: Gastroenterology | Admitting: Gastroenterology

## 2021-03-21 ENCOUNTER — Encounter
Admission: RE | Disposition: A | Discharge status: Home / Self Care | Payer: Self-pay | Source: Home / Self Care | Attending: Gastroenterology

## 2021-03-21 ENCOUNTER — Ambulatory Visit: Payer: Medicare Other | Admitting: Anesthesiology

## 2021-03-21 ENCOUNTER — Encounter: Payer: Self-pay | Admitting: Gastroenterology

## 2021-03-21 ENCOUNTER — Other Ambulatory Visit: Payer: Self-pay

## 2021-03-21 DIAGNOSIS — Z885 Allergy status to narcotic agent status: Secondary | ICD-10-CM | POA: Diagnosis not present

## 2021-03-21 DIAGNOSIS — D124 Benign neoplasm of descending colon: Secondary | ICD-10-CM | POA: Insufficient documentation

## 2021-03-21 DIAGNOSIS — Z1211 Encounter for screening for malignant neoplasm of colon: Secondary | ICD-10-CM | POA: Insufficient documentation

## 2021-03-21 DIAGNOSIS — Z853 Personal history of malignant neoplasm of breast: Secondary | ICD-10-CM | POA: Diagnosis not present

## 2021-03-21 DIAGNOSIS — K640 First degree hemorrhoids: Secondary | ICD-10-CM | POA: Insufficient documentation

## 2021-03-21 DIAGNOSIS — Z886 Allergy status to analgesic agent status: Secondary | ICD-10-CM | POA: Diagnosis not present

## 2021-03-21 DIAGNOSIS — Z860100 Personal history of colon polyps, unspecified: Secondary | ICD-10-CM

## 2021-03-21 DIAGNOSIS — K635 Polyp of colon: Secondary | ICD-10-CM

## 2021-03-21 DIAGNOSIS — Z79890 Hormone replacement therapy (postmenopausal): Secondary | ICD-10-CM | POA: Insufficient documentation

## 2021-03-21 DIAGNOSIS — Z9049 Acquired absence of other specified parts of digestive tract: Secondary | ICD-10-CM | POA: Diagnosis not present

## 2021-03-21 DIAGNOSIS — Z79899 Other long term (current) drug therapy: Secondary | ICD-10-CM | POA: Insufficient documentation

## 2021-03-21 HISTORY — DX: Hypothyroidism, unspecified: E03.9

## 2021-03-21 HISTORY — DX: Scoliosis, unspecified: M41.9

## 2021-03-21 HISTORY — DX: Cardiac arrhythmia, unspecified: I49.9

## 2021-03-21 HISTORY — PX: COLONOSCOPY WITH PROPOFOL: SHX5780

## 2021-03-21 SURGERY — COLONOSCOPY WITH PROPOFOL
Anesthesia: General

## 2021-03-21 MED ORDER — SODIUM CHLORIDE 0.9 % IV SOLN: INTRAVENOUS | Status: DC

## 2021-03-21 MED ORDER — LIDOCAINE HCL (PF) 2 % IJ SOLN
INTRAMUSCULAR | Status: DC | PRN
  Administered 2021-03-21: 60 mg via INTRADERMAL

## 2021-03-21 MED ORDER — PROPOFOL 10 MG/ML IV BOLUS
INTRAVENOUS | Status: DC | PRN
  Administered 2021-03-21 (×2): 50 mg via INTRAVENOUS

## 2021-03-21 MED ORDER — PROPOFOL 500 MG/50ML IV EMUL
INTRAVENOUS | Status: DC | PRN
  Administered 2021-03-21: 150 ug/kg/min via INTRAVENOUS

## 2021-03-21 NOTE — Anesthesia Preprocedure Evaluation (Addendum)
Anesthesia Evaluation  Patient identified by MRN, date of birth, ID band Patient awake    Reviewed: Allergy & Precautions, H&P , NPO status , Patient's Chart, lab work & pertinent test results, reviewed documented beta blocker date and time   Airway Mallampati: II   Neck ROM: full    Dental  (+) Teeth Intact   Pulmonary neg shortness of breath, asthma ,    Pulmonary exam normal        Cardiovascular Normal cardiovascular exam+ dysrhythmias  Rhythm:regular Rate:Normal     Neuro/Psych PSYCHIATRIC DISORDERS Depression negative neurological ROS     GI/Hepatic negative GI ROS, Neg liver ROS,   Endo/Other  Hypothyroidism   Renal/GU negative Renal ROS  negative genitourinary   Musculoskeletal   Abdominal   Peds  Hematology negative hematology ROS (+)   Anesthesia Other Findings Past Medical History: No date: Asthma No date: Breast cancer ([place]) No date: Cancer ([place]) No date: Depression No date: Dysrhythmia No date: History of chicken pox No date: Hyperlipemia No date: Hypothyroidism No date: Scoliosis Past Surgical History: No date: ABDOMINAL HYSTERECTOMY No date: CHOLECYSTECTOMY 09/01/2015: COLONOSCOPY WITH PROPOFOL; N/A     Comment:  Procedure: COLONOSCOPY WITH PROPOFOL;  Surgeon: Josefine Class, MD;  Location: [facility] ENDOSCOPY;  Service:               Endoscopy;  Laterality: N/A; No date: MASTECTOMY No date: nare polypectomy; Left No date: SPINAL FUSION No date: UPPER GI ENDOSCOPY   Reproductive/Obstetrics negative OB ROS                             Anesthesia Physical Anesthesia Plan  ASA: III  Anesthesia Plan: General   Post-op Pain Management:    Induction:   PONV Risk Score and Plan:   Airway Management Planned:   Additional Equipment:   Intra-op Plan:   Post-operative Plan:   Informed Consent: I have reviewed the patients History and Physical,  chart, labs and discussed the procedure including the risks, benefits and alternatives for the proposed anesthesia with the patient or authorized representative who has indicated his/her understanding and acceptance.     Dental Advisory Given  Plan Discussed with: CRNA  Anesthesia Plan Comments:         Anesthesia Quick Evaluation

## 2021-03-21 NOTE — Transfer of Care (Signed)
Immediate Anesthesia Transfer of Care Note  Patient: Tanya Snyder  Procedure(s) Performed: COLONOSCOPY WITH PROPOFOL (N/A )  Patient Location: PACU  Anesthesia Type:General  Level of Consciousness: sedated  Airway & Oxygen Therapy: Patient Spontanous Breathing and Patient connected to nasal cannula oxygen  Post-op Assessment: Report given to RN and Post -op Vital signs reviewed and stable  Post vital signs: Reviewed and stable  Last Vitals:  Vitals Value Taken Time  BP 153/76 03/21/21 0902  Temp    Pulse 65 03/21/21 0903  Resp 17 03/21/21 0903  SpO2 100 % 03/21/21 0903  Vitals shown include unvalidated device data.  Last Pain:  Vitals:   03/21/21 0807  TempSrc: Temporal  PainSc: 0-No pain         Complications: No complications documented.

## 2021-03-21 NOTE — Op Note (Signed)
[facility] Gastroenterology Patient Name: Tanya Snyder Procedure Date: 03/21/2021 8:32 AM MRN: [id number] Account #: [id number] Date of Birth: 02-13-1951 Admit Type: Outpatient Age: 70 Room: [facility] ENDO ROOM 4 Gender: Female Note Status: Finalized Procedure:             Colonoscopy Indications:           High risk colon cancer surveillance: Personal history                         of colonic polyps Providers:             Lucilla Lame MD, MD Referring MD:          Tracie Harrier, MD (Referring MD) Medicines:             Propofol per Anesthesia Complications:         No immediate complications. Procedure:             Pre-Anesthesia Assessment:                        - Prior to the procedure, a History and Physical was                         performed, and patient medications and allergies were                         reviewed. The patient's tolerance of previous                         anesthesia was also reviewed. The risks and benefits                         of the procedure and the sedation options and risks                         were discussed with the patient. All questions were                         answered, and informed consent was obtained. Prior                         Anticoagulants: The patient has taken no previous                         anticoagulant or antiplatelet agents. ASA Grade                         Assessment: II - A patient with mild systemic disease.                         After reviewing the risks and benefits, the patient                         was deemed in satisfactory condition to undergo the                         procedure.  After obtaining informed consent, the colonoscope was                         passed under direct vision. Throughout the procedure,                         the patient's blood pressure, pulse, and oxygen                         saturations were monitored continuously. The                          Colonoscope was introduced through the anus and                         advanced to the the cecum, identified by appendiceal                         orifice and ileocecal valve. The colonoscopy was                         performed without difficulty. The patient tolerated                         the procedure well. The quality of the bowel                         preparation was excellent. Findings:      The perianal and digital rectal examinations were normal.      A 5 mm polyp was found in the descending colon. The polyp was sessile.       The polyp was removed with a cold snare. Resection and retrieval were       complete.      Non-bleeding internal hemorrhoids were found during retroflexion. The       hemorrhoids were Grade I (internal hemorrhoids that do not prolapse). Impression:            - One 5 mm polyp in the descending colon, removed with                         a cold snare. Resected and retrieved.                        - Non-bleeding internal hemorrhoids. Recommendation:        - Discharge patient to home.                        - Resume previous diet.                        - Await pathology results.                        - Repeat colonoscopy in 7 years for surveillance. Procedure Code(s):     --- Professional ---                        [phone], Colonoscopy, flexible; with removal of  tumor(s), polyp(s), or other lesion(s) by snare                         technique Diagnosis Code(s):     --- Professional ---                        Z86.010, Personal history of colonic polyps                        K63.5, Polyp of colon CPT copyright 2019 American [employer] Association. All rights reserved. The codes documented in this report are preliminary and upon coder review may  be revised to meet current compliance requirements. Lucilla Lame MD, MD 03/21/2021 8:57:46 AM This report has been signed electronically. Number of Addenda: 0 Note  Initiated On: 03/21/2021 8:32 AM Scope Withdrawal Time: 0 hours 8 minutes 2 seconds  Total Procedure Duration: 0 hours 12 minutes 12 seconds  Estimated Blood Loss:  Estimated blood loss: none.      [facility]

## 2021-03-21 NOTE — H&P (Signed)
Lucilla Lame, MD [address] [place], [place] [address] [phone] Fax : [phone]  Primary Care Physician:  Tracie Harrier, MD Primary Gastroenterologist:  Dr. Allen Norris  Pre-Procedure History & Physical: HPI:  Tanya Snyder is a 70 y.o. female is here for an colonoscopy.   Past Medical History:  Diagnosis Date  . Asthma   . Breast cancer ([place])   . Cancer ([place])   . Depression   . Dysrhythmia   . History of chicken pox   . Hyperlipemia   . Hypothyroidism   . Scoliosis     Past Surgical History:  Procedure Laterality Date  . ABDOMINAL HYSTERECTOMY    . CHOLECYSTECTOMY    . COLONOSCOPY WITH PROPOFOL N/A 09/01/2015   Procedure: COLONOSCOPY WITH PROPOFOL;  Surgeon: Josefine Class, MD;  Location: [facility] ENDOSCOPY;  Service: Endoscopy;  Laterality: N/A;  . MASTECTOMY    . nare polypectomy Left   . SPINAL FUSION    . UPPER GI ENDOSCOPY      Prior to Admission medications   Medication Sig Start Date End Date Taking? Authorizing Provider  albuterol (VENTOLIN HFA) 108 (90 Base) MCG/ACT inhaler SMARTSIG:1-2 Puff(s) Via Inhaler Every 6 Hours PRN 01/15/20  Yes [provider]  amphetamine-dextroamphetamine (ADDERALL) 10 MG tablet Take 10 mg by mouth every morning. 08/09/20  Yes [provider]  ascorbic acid (VITAMIN C) 1000 MG tablet Take 1,000 mg by mouth daily.   Yes [provider]  clonazePAM (KLONOPIN) 0.5 MG tablet Take 0.5 mg by mouth 2 (two) times daily as needed for anxiety.   Yes [provider]  cyclobenzaprine (FLEXERIL) 5 MG tablet Take 5 mg by mouth at bedtime as needed. 05/10/20  Yes [provider]  ezetimibe (ZETIA) 10 MG tablet Take 10 mg by mouth daily. 05/24/20  Yes [provider]  levothyroxine (SYNTHROID) 125 MCG tablet Take 125 mcg by mouth daily. 03/29/20  Yes [provider]  montelukast (SINGULAIR) 10 MG tablet Take by mouth. 02/24/19  Yes [provider]   nitrofurantoin (MACRODANTIN) 50 MG capsule Take 50 mg by mouth daily at 6 PM.   Yes [provider]  Sod Picosulfate-Mag Ox-Cit Acd (CLENPIQ) 10-3.5-12 MG-GM -GM/160ML SOLN Take 320 mLs by mouth as directed. 03/14/21   Lucilla Lame, MD  [facility] INHUB 250-50 MCG/DOSE AEPB 1 puff 2 (two) times daily. 02/19/20  Yes [provider]  aluminum hydroxide-magnesium carbonate (GAVISCON) 95-358 MG/15ML SUSP Take 15 mLs by mouth.    [provider]  calcium carbonate (TUMS - DOSED IN MG ELEMENTAL CALCIUM) 500 MG chewable tablet Chew 1 tablet by mouth daily.    [provider]  clobetasol ointment (TEMOVATE) 0.05 % Apply topically daily. 05/31/20   [provider]  D-MANNOSE PO Take by mouth.    [provider]  diphenhydrAMINE (BENADRYL) 25 MG tablet Take 25 mg by mouth every 6 (six) hours as needed.    [provider]  EPINEPHrine 0.3 mg/0.3 mL IJ SOAJ injection Inject into the muscle as directed. 04/29/20   [provider]  hydrOXYzine (ATARAX/VISTARIL) 25 MG tablet Take 25 mg by mouth at bedtime as needed. 06/14/20   [provider]  Methenamine-Sodium Salicylate (CYSTEX) 676-195.0 MG TABS Take by mouth.    [provider]  oxyCODONE-acetaminophen (PERCOCET/ROXICET) 5-325 MG per tablet Take 1 tablet by mouth every 4 (four) hours as needed for severe pain.    [provider]  ramelteon (ROZEREM) 8 MG tablet Take  by mouth. 09/04/19 09/03/20  [provider]  triamcinolone (NASACORT) 55 MCG/ACT AERO nasal inhaler Place into the nose.    [provider]  triamcinolone cream (KENALOG) 0.5 % Apply topically 2 (two) times daily. 02/10/20   [provider]  VYVANSE 40 MG capsule Take 40 mg by mouth at bedtime. 02/10/20   [provider]  zolpidem (AMBIEN) 5 MG tablet Take 5 mg by mouth at bedtime as needed. 06/14/20   [provider]    Allergies as of 11/09/2020 - Review Complete  11/08/2020  Allergen Reaction Noted  . Acetaminophen Nausea Only 09/18/2006  . Hydrocodone-acetaminophen Other (See Comments) 11/30/2019    History reviewed. No pertinent family history.  Social History   Socioeconomic History  . Marital status: Divorced    Spouse name: Not on file  . Number of children: Not on file  . Years of education: Not on file  . Highest education level: Not on file  Occupational History  . Not on file  Tobacco Use  . Smoking status: Never Smoker  . Smokeless tobacco: Never Used  Vaping Use  . Vaping Use: Never used  Substance and Sexual Activity  . Alcohol use: No    Alcohol/week: 0.0 standard drinks  . Drug use: Never  . Sexual activity: Not on file  Other Topics Concern  . Not on file  Social History Narrative  . Not on file   Social Determinants of Health   Financial Resource Strain: Not on file  Food Insecurity: Not on file  Transportation Needs: Not on file  Physical Activity: Not on file  Stress: Not on file  Social Connections: Not on file  Intimate Partner Violence: Not on file    Review of Systems: See HPI, otherwise negative ROS  Physical Exam: BP (!) 173/89   Pulse 77   Temp (!) 97.5 F (36.4 C) (Temporal)   Ht 5\' 9"  (1.753 m)   Wt 69.9 kg   SpO2 97%   BMI 22.74 kg/m  General:   Alert,  pleasant and cooperative in NAD Head:  Normocephalic and atraumatic. Neck:  Supple; no masses or thyromegaly. Lungs:  Clear throughout to auscultation.    Heart:  Regular rate and rhythm. Abdomen:  Soft, nontender and nondistended. Normal bowel sounds, without guarding, and without rebound.   Neurologic:  Alert and  oriented x4;  grossly normal neurologically.  Impression/Plan: Tanya Snyder is here for an colonoscopy to be performed for a history of adenomatous polyps on 08/2015   Risks, benefits, limitations, and alternatives regarding  colonoscopy have been reviewed with the patient.  Questions have been answered.  All  parties agreeable.   Lucilla Lame, MD  03/21/2021, 8:25 AM

## 2021-03-21 NOTE — Anesthesia Postprocedure Evaluation (Signed)
Anesthesia Post Note  Patient: Tanya Snyder  Procedure(s) Performed: COLONOSCOPY WITH PROPOFOL (N/A )  Patient location during evaluation: PACU Anesthesia Type: General Level of consciousness: awake and alert Pain management: pain level controlled Vital Signs Assessment: post-procedure vital signs reviewed and stable Respiratory status: spontaneous breathing, nonlabored ventilation, respiratory function stable and patient connected to nasal cannula oxygen Cardiovascular status: blood pressure returned to baseline and stable Postop Assessment: no apparent nausea or vomiting Anesthetic complications: no   No complications documented.   Last Vitals:  Vitals:   03/21/21 0912 03/21/21 0922  BP: (!) 153/91 (!) 157/88  Pulse: 60 68  Resp: 16 18  Temp:    SpO2: 99% 97%    Last Pain:  Vitals:   03/21/21 0922  TempSrc:   PainSc: 0-No pain                 Molli Barrows

## 2021-03-22 ENCOUNTER — Encounter: Payer: Self-pay | Admitting: Gastroenterology

## 2021-03-22 LAB — SURGICAL PATHOLOGY

## 2021-03-23 ENCOUNTER — Encounter: Payer: Self-pay | Admitting: Gastroenterology

## 2021-03-28 ENCOUNTER — Other Ambulatory Visit: Payer: Self-pay | Admitting: Otolaryngology

## 2021-03-28 ENCOUNTER — Other Ambulatory Visit (HOSPITAL_COMMUNITY): Payer: Self-pay | Admitting: Otolaryngology

## 2021-03-28 DIAGNOSIS — H9201 Otalgia, right ear: Secondary | ICD-10-CM

## 2021-03-28 DIAGNOSIS — R42 Dizziness and giddiness: Secondary | ICD-10-CM

## 2021-04-12 ENCOUNTER — Ambulatory Visit
Admission: RE | Admit: 2021-04-12 | Discharge: 2021-04-12 | Disposition: A | Discharge status: Home / Self Care | Payer: Medicare Other | Source: Ambulatory Visit | Attending: Otolaryngology | Admitting: Otolaryngology

## 2021-04-12 ENCOUNTER — Other Ambulatory Visit: Payer: Self-pay

## 2021-04-12 DIAGNOSIS — H9201 Otalgia, right ear: Secondary | ICD-10-CM | POA: Insufficient documentation

## 2021-04-12 DIAGNOSIS — R42 Dizziness and giddiness: Secondary | ICD-10-CM | POA: Insufficient documentation

## 2021-04-12 MED ORDER — GADOBUTROL 1 MMOL/ML IV SOLN
6.0000 mL | Freq: Once | INTRAVENOUS | Status: AC | PRN
  Administered 2021-04-12: 6 mL via INTRAVENOUS

## 2021-04-24 ENCOUNTER — Other Ambulatory Visit: Payer: Self-pay | Admitting: Orthopaedic Surgery

## 2021-04-24 DIAGNOSIS — M4716 Other spondylosis with myelopathy, lumbar region: Secondary | ICD-10-CM

## 2021-04-24 DIAGNOSIS — M4150 Other secondary scoliosis, site unspecified: Secondary | ICD-10-CM

## 2021-06-05 DIAGNOSIS — R200 Anesthesia of skin: Secondary | ICD-10-CM | POA: Insufficient documentation

## 2021-06-05 DIAGNOSIS — R42 Dizziness and giddiness: Secondary | ICD-10-CM | POA: Insufficient documentation

## 2021-06-05 DIAGNOSIS — R2689 Other abnormalities of gait and mobility: Secondary | ICD-10-CM | POA: Insufficient documentation

## 2021-06-08 DIAGNOSIS — M25551 Pain in right hip: Secondary | ICD-10-CM | POA: Insufficient documentation

## 2022-01-10 ENCOUNTER — Encounter: Payer: Self-pay | Admitting: Otolaryngology

## 2022-01-19 NOTE — Discharge Instructions (Signed)
[facility] DISCHARGE INSTRUCTIONS FOR MYRINGOTOMY AND TUBE INSERTION  [facility] Carloyn Manner, M.D.   Diet:   After surgery, the patient should take only liquids and foods as tolerated.  The patient may then have a regular diet after the effects of anesthesia have worn off, usually about four to six hours after surgery.  Activities:   The patient should rest until the effects of anesthesia have worn off.  After this, there are no restrictions on the normal daily activities.  Medications:   You will be given antibiotic drops to be used in the ears postoperatively.  It is recommended to use 4 drops 2 times a day for 4 days, then the drops should be saved for possible future use.  The tubes should not cause any discomfort to the patient, but if there is any question, Tylenol should be given according to the instructions for the age of the patient.  Other medications should be continued normally.  Precautions:   Should there be recurrent drainage after the tubes are placed, the drops should be used for approximately 3-4 days.  If it does not clear, you should call the ENT office.  Earplugs:   Earplugs are only needed for those who are going to be submerged under water.  When taking a bath or shower and using a cup or showerhead to rinse hair, it is not necessary to wear earplugs.  These come in a variety of fashions, all of which can be obtained at our office.  However, if one is not able to come by the office, then silicone plugs can be found at most pharmacies.  It is not advised to stick anything in the ear that is not approved as an earplug.  Silly putty is not to be used as an earplug.  Swimming is allowed in patients after ear tubes are inserted, however, they must wear earplugs if they are going to be submerged under water.  For those children who are going to be swimming a lot, it is recommended to use a fitted ear mold, which can be made by our  audiologist.  If discharge is noticed from the ears, this most likely represents an ear infection.  We would recommend getting your eardrops and using them as indicated above.  If it does not clear, then you should call the ENT office.  For follow up, the patient should return to the ENT office three weeks postoperatively and then every six months as required by the doctor.

## 2022-01-24 ENCOUNTER — Encounter
Admission: RE | Disposition: A | Discharge status: Home / Self Care | Payer: Self-pay | Source: Home / Self Care | Attending: Otolaryngology

## 2022-01-24 ENCOUNTER — Ambulatory Visit: Payer: Medicare Other | Admitting: Anesthesiology

## 2022-01-24 ENCOUNTER — Other Ambulatory Visit: Payer: Self-pay

## 2022-01-24 ENCOUNTER — Encounter: Payer: Self-pay | Admitting: Otolaryngology

## 2022-01-24 ENCOUNTER — Ambulatory Visit
Admission: RE | Admit: 2022-01-24 | Discharge: 2022-01-24 | Disposition: A | Discharge status: Home / Self Care | Payer: Medicare Other | Attending: Otolaryngology | Admitting: Otolaryngology

## 2022-01-24 DIAGNOSIS — H6981 Other specified disorders of Eustachian tube, right ear: Secondary | ICD-10-CM | POA: Diagnosis present

## 2022-01-24 DIAGNOSIS — E039 Hypothyroidism, unspecified: Secondary | ICD-10-CM | POA: Diagnosis not present

## 2022-01-24 DIAGNOSIS — H9201 Otalgia, right ear: Secondary | ICD-10-CM | POA: Diagnosis not present

## 2022-01-24 HISTORY — PX: MYRINGOTOMY WITH TUBE PLACEMENT: SHX5663

## 2022-01-24 SURGERY — MYRINGOTOMY WITH TUBE PLACEMENT
Anesthesia: General | Site: Ear | Laterality: Right

## 2022-01-24 MED ORDER — LACTATED RINGERS IV SOLN: INTRAVENOUS | Status: DC

## 2022-01-24 MED ORDER — MIDAZOLAM HCL 5 MG/5ML IJ SOLN
INTRAMUSCULAR | Status: DC | PRN
  Administered 2022-01-24: 1 mg via INTRAVENOUS

## 2022-01-24 MED ORDER — ONDANSETRON HCL 4 MG/2ML IJ SOLN
4.0000 mg | Freq: Once | INTRAMUSCULAR | Status: AC | PRN
  Administered 2022-01-24: 4 mg via INTRAVENOUS

## 2022-01-24 MED ORDER — PROPOFOL 10 MG/ML IV BOLUS
INTRAVENOUS | Status: DC | PRN
  Administered 2022-01-24: 80 mg via INTRAVENOUS

## 2022-01-24 MED ORDER — GLYCOPYRROLATE 0.2 MG/ML IJ SOLN
INTRAMUSCULAR | Status: DC | PRN
  Administered 2022-01-24: .1 mg via INTRAVENOUS

## 2022-01-24 MED ORDER — CIPROFLOXACIN-DEXAMETHASONE 0.3-0.1 % OT SUSP
OTIC | Status: DC | PRN
  Administered 2022-01-24: 1 [drp] via OTIC

## 2022-01-24 MED ORDER — ACETAMINOPHEN 160 MG/5ML PO SOLN: 325.0000 mg | ORAL | Status: DC | PRN

## 2022-01-24 MED ORDER — ACETAMINOPHEN 325 MG PO TABS
325.0000 mg | ORAL_TABLET | ORAL | Status: DC | PRN
  Administered 2022-01-24: 325 mg via ORAL

## 2022-01-24 MED ORDER — LIDOCAINE HCL (CARDIAC) PF 100 MG/5ML IV SOSY
PREFILLED_SYRINGE | INTRAVENOUS | Status: DC | PRN
  Administered 2022-01-24: 40 mg via INTRAVENOUS

## 2022-01-24 SURGICAL SUPPLY — 10 items
BALL CTTN LRG ABS STRL LF (GAUZE/BANDAGES/DRESSINGS) ×1
BLADE MYR LANCE NRW W/HDL (BLADE) ×2 IMPLANT
CANISTER SUCT 1200ML W/VALVE (MISCELLANEOUS) ×2 IMPLANT
COTTONBALL LRG STERILE PKG (GAUZE/BANDAGES/DRESSINGS) ×2 IMPLANT
GLOVE SURG GAMMEX PI TX LF 7.5 (GLOVE) ×2 IMPLANT
STRAP BODY AND KNEE 60X3 (MISCELLANEOUS) ×2 IMPLANT
TOWEL OR 17X26 4PK STRL BLUE (TOWEL DISPOSABLE) ×2 IMPLANT
TUBING CONN 6MMX3.1M (TUBING) ×1
TUBING SUCTION CONN 0.25 STRL (TUBING) ×1 IMPLANT
medtronic xomeed armstron beveled grommet ear tube ×1 IMPLANT

## 2022-01-24 NOTE — Transfer of Care (Signed)
Immediate Anesthesia Transfer of Care Note  Patient: Tanya Snyder  Procedure(s) Performed: MYRINGOTOMY WITH TUBE PLACEMENT (Right: Ear)  Patient Location: PACU  Anesthesia Type: General  Level of Consciousness: awake, alert  and patient cooperative  Airway and Oxygen Therapy: Patient Spontanous Breathing and Patient connected to supplemental oxygen  Post-op Assessment: Post-op Vital signs reviewed, Patient's Cardiovascular Status Stable, Respiratory Function Stable, Patent Airway and No signs of Nausea or vomiting  Post-op Vital Signs: Reviewed and stable  Complications: No notable events documented.

## 2022-01-24 NOTE — Op Note (Signed)
..  01/24/2022  9:20 AM    Donetta Potts  [id number]   Pre-Op Dx:  Verdie Drown tube dysfuction, right sided otalgia  Post-op Dx: same  Proc: Right myringotomy and tympanostomy tube placement  Surg: Jeannie Fend Delanie Tirrell  Anes:  General by mask  EBL:  None  Comp:  None  Indications:  71 y.o. female with multiyear history of right sided otalgia and popping.  Treated for multiple episodes acute otitis media with antibiotics.  Evaluated by dentist and per patient report did not think otalgia was from TMJ dysfunction.  Tympanogram shows mild negative pressures.  Trial of neuropathic medication gabapentin as well.  Given risk of continued oral antibiotic treatment, decision made to proceed with right M&T to see if correcting mild negative pressure helps with right sided chronic otalgia and popping.  Findings:  Tube place posterior inferior.  Procedure: With the patient in a comfortable supine position, general mask anesthesia was administered.  At an appropriate level, microscope and speculum were used to examine and clean the RIGHT ear canal.  The findings were as described above.  An anterior posterior radial myringotomy incision was sharply executed.  Middle ear contents were suctioned clear with a size 5 otologic suction.  A PE tube was placed without difficulty using a Rosen pick and Animal nutritionist.  Ciprodex otic solution was instilled into the external canal, and insufflated into the middle ear.  A cotton ball was placed at the external meatus. Hemostasis was observed.  This side was completed.  Following this  The patient was returned to anesthesia, awakened, and transferred to recovery in stable condition.  Dispo:  PACU to home  Plan: Routine drop use and water precautions.  Recheck my office three weeks.   Kahley Leib 9:20 AM 01/24/2022

## 2022-01-24 NOTE — Anesthesia Procedure Notes (Signed)
Procedure Name: General with mask airway Date/Time: 01/24/2022 9:14 AM Performed by: Mayme Genta, CRNA Pre-anesthesia Checklist: Patient identified, Patient being monitored, Emergency Drugs available, Timeout performed and Suction available Patient Re-evaluated:Patient Re-evaluated prior to induction Oxygen Delivery Method: Circle system utilized Preoxygenation: Pre-oxygenation with 100% oxygen Induction Type: Combination inhalational/ intravenous induction Ventilation: Mask ventilation without difficulty Dental Injury: Teeth and Oropharynx as per pre-operative assessment

## 2022-01-24 NOTE — Anesthesia Preprocedure Evaluation (Signed)
Anesthesia Evaluation  Patient identified by MRN, date of birth, ID band Patient awake    Reviewed: Allergy & Precautions, H&P , NPO status , Patient's Chart, lab work & pertinent test results, reviewed documented beta blocker date and time   Airway Mallampati: II   Neck ROM: full    Dental  (+) Teeth Intact   Pulmonary neg shortness of breath, asthma ,    Pulmonary exam normal breath sounds clear to auscultation       Cardiovascular Exercise Tolerance: Good Normal cardiovascular exam+ dysrhythmias  Rhythm:regular Rate:Normal     Neuro/Psych PSYCHIATRIC DISORDERS Depression negative neurological ROS     GI/Hepatic negative GI ROS, Neg liver ROS,   Endo/Other  Hypothyroidism   Renal/GU negative Renal ROS  negative genitourinary   Musculoskeletal   Abdominal Normal abdominal exam  (+) - obese, scaphoid  Abdomen: soft.    Peds  Hematology negative hematology ROS (+)   Anesthesia Other Findings Past Medical History: No date: Asthma No date: Breast cancer ([place]) No date: Cancer ([place]) No date: Depression No date: Dysrhythmia No date: History of chicken pox No date: Hyperlipemia No date: Hypothyroidism No date: Scoliosis Past Surgical History: No date: ABDOMINAL HYSTERECTOMY No date: CHOLECYSTECTOMY 09/01/2015: COLONOSCOPY WITH PROPOFOL; N/A     Comment:  Procedure: COLONOSCOPY WITH PROPOFOL;  Surgeon: Josefine Class, MD;  Location: [facility] ENDOSCOPY;  Service:               Endoscopy;  Laterality: N/A; No date: MASTECTOMY No date: nare polypectomy; Left No date: SPINAL FUSION No date: UPPER GI ENDOSCOPY   Reproductive/Obstetrics negative OB ROS                             Anesthesia Physical  Anesthesia Plan  ASA: III  Anesthesia Plan: General   Post-op Pain Management: Minimal or no pain anticipated   Induction: Inhalational and Intravenous  PONV Risk  Score and Plan:   Airway Management Planned: Mask  Additional Equipment:   Intra-op Plan:   Post-operative Plan:   Informed Consent: I have reviewed the patients History and Physical, chart, labs and discussed the procedure including the risks, benefits and alternatives for the proposed anesthesia with the patient or authorized representative who has indicated his/her understanding and acceptance.     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   History of recurrent UTIs 12/21/2016   Chronic bilateral low back pain without sciatica 08/16/2016   Enthesopathy of right hip 08/07/2016   Mid back pain, chronic 04/29/2015   Personal history of breast cancer 04/29/2015   Malignant neoplasm of right female breast ([place]) 01/07/2015   Lower urinary tract infectious disease 05/24/2014   Pain in thoracic spine 04/14/2012   Scoliosis (and kyphoscoliosis), idiopathic 04/14/2012   Pain in joint, pelvic region and thigh 12/13/2005   Other specified acquired hypothyroidism 12/04/2004  Acute sinusitis, unspecified 12/04/2004   Hypoglycemia, unspecified 10/23/2004    CBC Latest Ref Rng & Units 01/03/2015 12/28/2013  WBC 3.6 - 11.0 x10 3/mm 3 3.6 3.8  Hemoglobin 12.0 - 16.0 g/dL 13.7 13.3  Hematocrit 35.0 - 47.0 % 41.2 38.6  Platelets 150 - 440 x10 3/mm 3 272 224   BMP Latest Ref Rng & Units 01/03/2015 12/28/2013  Glucose 65 - 99 mg/dL 86 84  BUN 7 - 18 mg/dL 11 12  Creatinine 0.60 -  1.30 mg/dL 0.64 0.56(L)  Sodium 136 - 145 mmol/L 134(L) 138  Potassium 3.5 - 5.1 mmol/L 3.7 4.2  Chloride 98 - 107 mmol/L 101 105  CO2 21 - 32 mmol/L 27 31  Calcium 8.5 - 10.1 mg/dL 8.5 9.1    Risks and benefits of anesthesia discussed at length, patient or surrogate demonstrates understanding. Appropriately NPO. Plan to proceed with anesthesia.  Champ Mungo, MD 01/24/22

## 2022-01-24 NOTE — Anesthesia Postprocedure Evaluation (Signed)
Anesthesia Post Note  Patient: Tanya Snyder  Procedure(s) Performed: MYRINGOTOMY WITH TUBE PLACEMENT (Right: Ear)     Patient location during evaluation: PACU Anesthesia Type: General Level of consciousness: awake and alert Pain management: pain level controlled Vital Signs Assessment: post-procedure vital signs reviewed and stable Respiratory status: spontaneous breathing, nonlabored ventilation, respiratory function stable and patient connected to nasal cannula oxygen Cardiovascular status: stable and blood pressure returned to baseline Postop Assessment: no apparent nausea or vomiting Anesthetic complications: no   No notable events documented.  Sinda Du

## 2022-01-24 NOTE — H&P (Signed)
..  History and Physical paper copy reviewed and updated date of procedure and will be scanned into system.  Patient seen and examined and right ear marked. 

## 2022-01-25 ENCOUNTER — Encounter: Payer: Self-pay | Admitting: Otolaryngology

## 2022-02-23 ENCOUNTER — Other Ambulatory Visit: Payer: Self-pay | Admitting: Internal Medicine

## 2022-02-23 ENCOUNTER — Other Ambulatory Visit (HOSPITAL_COMMUNITY): Payer: Self-pay | Admitting: Internal Medicine

## 2022-02-23 DIAGNOSIS — R970 Elevated carcinoembryonic antigen [CEA]: Secondary | ICD-10-CM

## 2022-02-23 DIAGNOSIS — Z853 Personal history of malignant neoplasm of breast: Secondary | ICD-10-CM

## 2022-04-18 ENCOUNTER — Other Ambulatory Visit: Payer: Self-pay | Admitting: Internal Medicine

## 2022-04-18 DIAGNOSIS — R970 Elevated carcinoembryonic antigen [CEA]: Secondary | ICD-10-CM

## 2022-04-18 DIAGNOSIS — Z853 Personal history of malignant neoplasm of breast: Secondary | ICD-10-CM

## 2022-05-29 ENCOUNTER — Ambulatory Visit: Payer: Medicare Other

## 2022-06-24 ENCOUNTER — Other Ambulatory Visit: Payer: Self-pay

## 2022-06-24 ENCOUNTER — Encounter: Payer: Self-pay | Admitting: Emergency Medicine

## 2022-06-24 ENCOUNTER — Ambulatory Visit
Admission: EM | Admit: 2022-06-24 | Discharge: 2022-06-24 | Disposition: A | Discharge status: Home / Self Care | Payer: Medicare Other | Attending: Emergency Medicine | Admitting: Emergency Medicine

## 2022-06-24 DIAGNOSIS — S0502XA Injury of conjunctiva and corneal abrasion without foreign body, left eye, initial encounter: Secondary | ICD-10-CM

## 2022-06-24 MED ORDER — OFLOXACIN 0.3 % OP SOLN
1.0000 [drp] | Freq: Four times a day (QID) | OPHTHALMIC | 0 refills | Status: DC
Start: 2022-06-24 — End: 2023-06-11

## 2022-06-24 NOTE — Discharge Instructions (Addendum)
Use the antibiotic eyedrops as prescribed.    Follow-up with your eye care provider this week for a recheck.    Go to the emergency department if you have acute eye pain, changes in your vision, or other concerning symptoms.

## 2022-06-24 NOTE — ED Triage Notes (Signed)
Patient reports having a stick poke left eye.  Patient has pain, watery eye.  Reports no change in vision.  Patient wears prescription lenses.  No visible injury.  Eye is watery and eye

## 2022-06-24 NOTE — ED Provider Notes (Signed)
Roderic Palau    CSN: [id number] Arrival date & time: 06/24/22  0802      History   Chief Complaint Chief Complaint  Patient presents with   Eye Problem    HPI Tanya Snyder is a 71 y.o. female.  Patient presents with left eye pain after she accidentally poked her eye with a bush branch 2 days ago.  Her eye has been tearing and irritated.  No acute eye pain, purulent drainage, changes in vision, fever, chills, or other symptoms.  Her medical history includes breast cancer, asthma, hypothyroidism, scoliosis, chronic low back pain, depression, mitral valve prolapse, recurrent UTIs.  The history is provided by the patient and medical records.    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   History of recurrent UTIs 12/21/2016   Chronic bilateral low back pain without sciatica 08/16/2016   Enthesopathy of right hip 08/07/2016   Mid back pain, chronic 04/29/2015   Personal history of breast cancer 04/29/2015   Malignant neoplasm of right female breast ([place]) 01/07/2015   Lower urinary tract infectious disease 05/24/2014   Pain in thoracic spine 04/14/2012   Scoliosis (and kyphoscoliosis), idiopathic 04/14/2012   Pain in joint, pelvic region and thigh 12/13/2005   Other specified acquired hypothyroidism 12/04/2004   Acute sinusitis, unspecified 12/04/2004   Hypoglycemia, unspecified 10/23/2004     Past Surgical History:  Procedure Laterality Date   ABDOMINAL HYSTERECTOMY     CHOLECYSTECTOMY     COLONOSCOPY WITH PROPOFOL N/A 09/01/2015   Procedure: COLONOSCOPY WITH PROPOFOL;  Surgeon: Josefine Class, MD;  Location: [facility] ENDOSCOPY;  Service: Endoscopy;  Laterality: N/A;   COLONOSCOPY WITH PROPOFOL N/A 03/21/2021   Procedure: COLONOSCOPY WITH PROPOFOL;  Surgeon: Lucilla Lame, MD;  Location: [facility] ENDOSCOPY;  Service: Endoscopy;  Laterality: N/A;   MASTECTOMY     MYRINGOTOMY WITH TUBE PLACEMENT Right 01/24/2022   Procedure: MYRINGOTOMY WITH TUBE PLACEMENT;  Surgeon: Carloyn Manner, MD;  Location: [facility];  Service: ENT;  Laterality: Right;  pt. request later in morning 8 am   nare polypectomy Left    SPINAL FUSION     UPPER GI ENDOSCOPY      OB History   No obstetric history on file.      Home Medications    Prior to Admission medications   Medication Sig Start Date End Date Taking? Authorizing Provider  ofloxacin (OCUFLOX) 0.3 % ophthalmic solution Place 1 drop into the left eye 4 (four) times daily. 06/24/22  Yes Sharion Balloon, NP  albuterol (VENTOLIN HFA) 108 (90 Base) MCG/ACT inhaler SMARTSIG:1-2 Puff(s) Via Inhaler Every 6 Hours PRN 01/15/20   [provider]  aluminum hydroxide-magnesium carbonate (GAVISCON) 95-358 MG/15ML SUSP Take 15 mLs by mouth.    [provider]  amphetamine-dextroamphetamine (ADDERALL) 10 MG tablet Take 10 mg by  mouth every morning. 08/09/20   [provider]  ascorbic acid (VITAMIN C) 1000 MG tablet Take 1,000 mg by mouth daily.    [provider]  budesonide-formoterol (SYMBICORT) 160-4.5 MCG/ACT inhaler Inhale 2 puffs into the lungs 2 (two) times daily.    [provider]  calcium carbonate (TUMS - DOSED IN MG ELEMENTAL CALCIUM) 500 MG chewable tablet Chew 1 tablet by mouth daily.    [provider]  clobetasol ointment (TEMOVATE) 0.05 % Apply topically daily. 05/31/20   [provider]  clonazePAM (KLONOPIN) 0.5 MG tablet Take 0.5 mg by mouth 2 (two) times daily as needed for anxiety.    [provider]  cyclobenzaprine (FLEXERIL) 5 MG tablet Take 5 mg by mouth at bedtime as needed. 05/10/20   [provider]  D-MANNOSE PO Take by mouth.    [provider]  diphenhydrAMINE (BENADRYL) 25 MG tablet Take 25 mg by mouth every 6 (six) hours as needed.    [provider]  EPINEPHrine 0.3 mg/0.3 mL IJ SOAJ injection Inject into the muscle as directed. Patient not taking: Reported on 01/10/2022 04/29/20   [provider]  ezetimibe (ZETIA) 10 MG tablet Take 10 mg by mouth daily. 05/24/20   [provider]  levothyroxine (SYNTHROID) 125 MCG tablet Take 125 mcg by mouth daily. 03/29/20   [provider]  Methenamine-Sodium Salicylate 644-034.7 MG TABS Take by mouth.    [provider]  montelukast (SINGULAIR) 10 MG tablet Take by mouth. 02/24/19   [provider]  nitrofurantoin (MACRODANTIN) 50 MG capsule Take 50 mg by mouth daily at 6 PM.    [provider]  ramelteon (ROZEREM) 8 MG tablet Take by mouth. 09/04/19 09/03/20  [provider]  triamcinolone (NASACORT) 55 MCG/ACT AERO nasal inhaler Place into the nose.    [provider]  triamcinolone cream (KENALOG) 0.5 % Apply topically 2 (two) times daily. 02/10/20   [provider]  [place] INHUB 250-50 MCG/DOSE AEPB 1 puff 2 (two) times daily. Patient not taking: Reported on 01/10/2022 02/19/20   [provider]  zolpidem (AMBIEN) 5 MG tablet Take 5 mg by mouth at bedtime as needed. 06/14/20   [provider]    Family History History reviewed. No pertinent family history.  Social History Social History   Tobacco Use   Smoking status: Never   Smokeless tobacco: Never  Vaping Use   Vaping Use: Never used  Substance Use Topics   Alcohol use: No    Alcohol/week: 0.0 standard drinks of alcohol    Drug use: Never     Allergies   Patient has no known allergies.   Review of Systems Review of Systems  Constitutional:  Negative for chills and fever.  Eyes:  Positive for photophobia and redness. Negative for pain and visual disturbance.  Skin:  Negative for color change and rash.  All other systems reviewed and are negative.    Physical Exam Triage Vital Signs ED Triage Vitals  Enc Vitals Group     BP      Pulse      Resp      Temp      Temp src      SpO2      Weight      Height      Head Circumference      Peak Flow      Pain Score      Pain Loc  Pain Edu?      Excl. in [place]?    No data found.  Updated Vital Signs BP 137/86 (BP Location: Left Arm)   Pulse 83   Temp 98 F (36.7 C) (Oral)   Resp 20   SpO2 94%   Visual Acuity Right Eye Distance:   Left Eye Distance:   Bilateral Distance:    Right Eye Near:   Left Eye Near:    Bilateral Near:     Physical Exam Vitals and nursing note reviewed.  Constitutional:      General: She is not in acute distress.    Appearance: She is well-developed.  HENT:     Mouth/Throat:     Mouth: Mucous membranes are moist.  Eyes:     General: Lids are normal. Vision grossly intact.        Right eye: No discharge.        Left eye: No discharge.     Extraocular Movements: Extraocular movements intact.     Conjunctiva/sclera:     Right eye: Right conjunctiva is injected.     Left eye: Left conjunctiva is injected.     Pupils: Pupils are equal, round, and reactive to light.     Left eye: Fluorescein uptake present.      Comments: Left eye: Area of fluorescein uptake as noted on diagram.  Cardiovascular:     Rate and Rhythm: Normal rate and regular rhythm.     Heart sounds: Normal heart sounds.  Pulmonary:     Effort: Pulmonary effort is normal. No respiratory distress.     Breath sounds: Normal breath sounds.  Musculoskeletal:     Cervical back: Neck supple.  Skin:    General: Skin is warm and dry.   Neurological:     Mental Status: She is alert.  Psychiatric:        Mood and Affect: Mood normal.        Behavior: Behavior normal.      UC Treatments / Results  Labs (all labs ordered are listed, but only abnormal results are displayed) Labs Reviewed - No data to display  EKG   Radiology No results found.  Procedures Procedures (including critical care time)  Medications Ordered in UC Medications - No data to display  Initial Impression / Assessment and Plan / UC Course  I have reviewed the triage vital signs and the nursing notes.  Pertinent labs & imaging results that were available during my care of the patient were reviewed by me and considered in my medical decision making (see chart for details).    Left corneal abrasion.  Treating with ofloxacin eyedrops.  Instructed patient to follow-up with her eye care provider this week for recheck.  She states she is normally seen at [facility] eye care and will call them for an appointment.  ED precautions discussed.  Education provided on corneal abrasion.  Patient agrees to plan of care.  Final Clinical Impressions(s) / UC Diagnoses   Final diagnoses:  Abrasion of left cornea, initial encounter     Discharge Instructions      Use the antibiotic eyedrops as prescribed.    Follow-up with your eye care provider this week for a recheck.    Go to the emergency department if you have acute eye pain, changes in your vision, or other concerning symptoms.        ED Prescriptions     Medication Sig Dispense Auth. Provider   ofloxacin (OCUFLOX) 0.3 % ophthalmic solution  Place 1 drop into the left eye 4 (four) times daily. 5 mL Sharion Balloon, NP      PDMP not reviewed this encounter.   Sharion Balloon, NP 06/24/22 [phone]

## 2022-08-02 ENCOUNTER — Ambulatory Visit (INDEPENDENT_AMBULATORY_CARE_PROVIDER_SITE_OTHER): Payer: Medicare Other | Admitting: Gastroenterology

## 2022-08-02 ENCOUNTER — Encounter: Payer: Self-pay | Admitting: Gastroenterology

## 2022-08-02 VITALS — BP 136/82 | HR 79 | Temp 98.0°F | Ht 69.0 in | Wt 152.0 lb

## 2022-08-02 DIAGNOSIS — K5901 Slow transit constipation: Secondary | ICD-10-CM

## 2022-08-02 MED ORDER — TRULANCE 3 MG PO TABS: 1.0000 | ORAL_TABLET | Freq: Every day | ORAL | 1 refills | Status: DC

## 2022-08-02 NOTE — Patient Instructions (Signed)
Trulance samples given   Take one tablet daily 30 minutes before food

## 2022-08-02 NOTE — Progress Notes (Signed)
Primary Care Physician: Tracie Harrier, MD  Primary Gastroenterologist:  Dr. Lucilla Lame  Chief Complaint  Patient presents with   Abdominal Pain    HPI: Tanya Snyder is a 71 y.o. female here after seeing me in 2021 with a history of gastritis and adenomatous colon polyps.  She reported that she had an upper endoscopy in 2012 and was put on Nexium but was not taking it regularly.  She reports 4-6 episodes of severe abdominal pain that usually last 2 to 3 days which she treats with antacids and PPIs.  This is what she had reported at her last visit to me.  At her last visit she denied any unexplained weight loss fevers chills nausea or vomiting. The patient main concern today is simplifying her regimen for her constipation.  The patient states that she is not having any rectal bleeding nausea vomiting fevers chills black stools or bloody stools but is taking stool softeners and laxatives daily.  She also takes a probiotic 3 times a day.  She also has been on Metamucil which she said gave her bloating but her cholesterol came down so she went back on it.  The patient also reports that she has to sometimes take MiraLAX when she gets constipated and it is usually exacerbated by certain things she eats.    Past Medical History:  Diagnosis Date   Asthma    Breast cancer ([place])    Cancer ([place])    Depression    Dysrhythmia    History of chicken pox    Hyperlipemia    Hypothyroidism    Scoliosis     Current Outpatient Medications  Medication Sig Dispense Refill   albuterol (VENTOLIN HFA) 108 (90 Base) MCG/ACT inhaler SMARTSIG:1-2 Puff(s) Via Inhaler Every 6 Hours PRN     aluminum hydroxide-magnesium carbonate (GAVISCON) 95-358 MG/15ML SUSP Take 15 mLs by mouth.     amphetamine-dextroamphetamine (ADDERALL) 10 MG tablet Take 10 mg by mouth every morning.     ascorbic acid (VITAMIN C) 1000 MG tablet Take 1,000 mg by mouth daily.     calcium carbonate (TUMS - DOSED IN MG  ELEMENTAL CALCIUM) 500 MG chewable tablet Chew 1 tablet by mouth daily.     clobetasol ointment (TEMOVATE) 0.05 % Apply topically daily.     clonazePAM (KLONOPIN) 0.5 MG tablet Take 0.5 mg by mouth 2 (two) times daily as needed for anxiety.     cyclobenzaprine (FLEXERIL) 5 MG tablet Take 5 mg by mouth at bedtime as needed.     D-MANNOSE PO Take by mouth.     diphenhydrAMINE (BENADRYL) 25 MG tablet Take 25 mg by mouth every 6 (six) hours as needed.     ezetimibe (ZETIA) 10 MG tablet Take 10 mg by mouth daily.     levothyroxine (SYNTHROID) 125 MCG tablet Take 125 mcg by mouth daily.     Methenamine-Sodium Salicylate 712-458.0 MG TABS Take by mouth.     nitrofurantoin (MACRODANTIN) 50 MG capsule Take 50 mg by mouth daily at 6 PM.     ofloxacin (OCUFLOX) 0.3 % ophthalmic solution Place 1 drop into the left eye 4 (four) times daily. 5 mL 0   triamcinolone (NASACORT) 55 MCG/ACT AERO nasal inhaler Place into the nose.     triamcinolone cream (KENALOG) 0.5 % Apply topically 2 (two) times daily.     WIXELA INHUB 250-50 MCG/DOSE AEPB 1 puff 2 (two) times daily.     zolpidem (AMBIEN) 5 MG tablet Take 5  mg by mouth at bedtime as needed.     ramelteon (ROZEREM) 8 MG tablet Take by mouth.     No current facility-administered medications for this visit.    Allergies as of 08/02/2022   (No Known Allergies)    ROS:  General: Negative for anorexia, weight loss, fever, chills, fatigue, weakness. ENT: Negative for hoarseness, difficulty swallowing , nasal congestion. CV: Negative for chest pain, angina, palpitations, dyspnea on exertion, peripheral edema.  Respiratory: Negative for dyspnea at rest, dyspnea on exertion, cough, sputum, wheezing.  GI: See history of present illness. GU:  Negative for dysuria, hematuria, urinary incontinence, urinary frequency, nocturnal urination.  Endo: Negative for unusual weight change.    Physical Examination:   BP 136/82   Pulse 79   Temp 98 F (36.7 C) (Oral)    Ht '5\' 9"'$  (1.753 m)   Wt 152 lb (68.9 kg)   BMI 22.45 kg/m   General: Well-nourished, well-developed in no acute distress.  Eyes: No icterus. Conjunctivae pink. Neuro: Alert and oriented x 3.  Grossly intact. Psych: Alert and cooperative, normal mood and affect.  Labs:    Imaging Studies: No results found.  Assessment and Plan:   Tanya Snyder is a 71 y.o. y/o female comes in today for follow-up with a history of constipation.  The patient will be given samples of Trulance.  The patient will also be given a prescription for Trulance.  She has been told that this can simplify her medications and she can start backing off the other medications if her Trulance starts to work.  The patient has been told that Linzess does not appear to be covered by her insurance and Trulance and Amitiza appear to have some coverage.  She will try this and let me know if she has any further problems.  The patient has been explained the plan and agrees with it.     Lucilla Lame, MD. Marval Regal    Note: This dictation was prepared with Dragon dictation along with smaller phrase technology. Any transcriptional errors that result from this process are unintentional.

## 2022-08-14 DIAGNOSIS — R7303 Prediabetes: Secondary | ICD-10-CM | POA: Insufficient documentation

## 2022-09-10 ENCOUNTER — Ambulatory Visit: Payer: Medicare PPO | Admitting: Gastroenterology

## 2023-03-29 ENCOUNTER — Ambulatory Visit (INDEPENDENT_AMBULATORY_CARE_PROVIDER_SITE_OTHER): Payer: Medicare Other | Admitting: Family Medicine

## 2023-03-29 ENCOUNTER — Encounter: Payer: Self-pay | Admitting: Family Medicine

## 2023-03-29 VITALS — BP 120/68 | HR 64 | Temp 97.7°F | Ht 68.25 in | Wt 144.1 lb

## 2023-03-29 DIAGNOSIS — Z780 Asymptomatic menopausal state: Secondary | ICD-10-CM

## 2023-03-29 DIAGNOSIS — H04129 Dry eye syndrome of unspecified lacrimal gland: Secondary | ICD-10-CM | POA: Insufficient documentation

## 2023-03-29 DIAGNOSIS — H259 Unspecified age-related cataract: Secondary | ICD-10-CM | POA: Diagnosis not present

## 2023-03-29 DIAGNOSIS — L84 Corns and callosities: Secondary | ICD-10-CM

## 2023-03-29 DIAGNOSIS — H269 Unspecified cataract: Secondary | ICD-10-CM | POA: Insufficient documentation

## 2023-03-29 DIAGNOSIS — H903 Sensorineural hearing loss, bilateral: Secondary | ICD-10-CM

## 2023-03-29 DIAGNOSIS — I341 Nonrheumatic mitral (valve) prolapse: Secondary | ICD-10-CM | POA: Diagnosis not present

## 2023-03-29 NOTE — Progress Notes (Unsigned)
New Patient Office Visit  Subjective    Patient ID: Tanya Snyder, female    DOB: [birth year]  Age: 72 y.o. MRN: [id number]  CC:  Chief Complaint  Patient presents with   Establish Care    HPI Tanya DameGeraldine V Snyder presents to establish care Patient moved recently form [place], [place] and needed a new PCP. Patient states that she is going to be keeping her internist in [place], but she needs referrals to new specialists.   Ophthalmology- she reports early cataracts and extreme dry eyes.  Pt is requesting new referral. Orders placed.  Scoliosis-- pt reports she has had surgery in the 1960's, she was a Runner, broadcasting/film/videoteacher for many years, states that she has an uneven gait and gets calluses frequently on her feet. Would like a new referral to the podiatrist. Is seeing a neurologist for her chronic back pain from the scoliosis, states that she would like her neurontin renewed and muscle relaxer renewed.   Cardiac-- pt reports a history of mitral valve prolapse and hyperlipidemia. She reports her previous cardiologist has retired and she needs a new one. I reviewed her ECHO from 2020 which does show mild prolapse. She reports no symptoms from the prolapse.  Hearing difficulty-- pt reports a history of ear infections as a child, states that she does have an ear tube on the right. Was seeing Dr. Carmie Endhrighton for this, reports some hearing loss. States she did see an audiologist and did get hearing aids, states they don't work as well because she has an android phone.   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  Outpatient Encounter Medications as of 03/29/2023  Medication Sig   albuterol (VENTOLIN HFA) 108 (90 Base) MCG/ACT inhaler SMARTSIG:1-2 Puff(s) Via Inhaler Every 6 Hours PRN    aluminum hydroxide-magnesium carbonate (GAVISCON) 95-358 MG/15ML SUSP Take 15 mLs by mouth.   amLODipine (NORVASC) 2.5 MG tablet Take 2.5 mg by mouth daily.   amphetamine-dextroamphetamine (ADDERALL) 10 MG tablet Take 10 mg by mouth every morning.   ascorbic acid (VITAMIN C) 1000 MG tablet Take 1,000 mg by mouth daily.   calcium carbonate (TUMS - DOSED IN MG ELEMENTAL CALCIUM) 500 MG chewable tablet Chew 1 tablet by mouth daily.   clobetasol ointment (TEMOVATE) 0.05 % Apply topically daily.   clonazePAM (KLONOPIN) 0.5 MG tablet Take 0.5 mg by mouth 2 (two) times daily as needed for anxiety.   cyclobenzaprine (FLEXERIL) 5 MG tablet Take 5 mg by mouth at bedtime as needed.   D-MANNOSE PO Take by mouth.   ezetimibe (ZETIA) 10 MG tablet Take 10 mg by mouth daily.   levothyroxine (SYNTHROID) 125 MCG tablet Take 125 mcg by mouth daily.   Methenamine-Sodium Salicylate 162-162.5 MG TABS Take by mouth.   nitrofurantoin (MACRODANTIN) 50 MG capsule Take 50 mg by mouth daily at 6 PM.   ofloxacin (OCUFLOX) 0.3 % ophthalmic solution Place 1 drop into the left eye 4 (four) times daily.   Plecanatide (TRULANCE) 3 MG TABS Take 1 tablet by mouth daily.   triamcinolone (NASACORT) 55 MCG/ACT AERO nasal inhaler Place into the nose.   triamcinolone cream (KENALOG) 0.5 % Apply topically 2 (two) times daily.   WIXELA INHUB 250-50 MCG/DOSE AEPB 1 puff 2 (two) times daily.   zolpidem (  AMBIEN) 5 MG tablet Take 5 mg by mouth at bedtime as needed.   gabapentin (NEURONTIN) 100 MG capsule Take 100-300 mg by mouth at bedtime.   [DISCONTINUED] diphenhydrAMINE (BENADRYL) 25 MG tablet Take 25 mg by mouth every 6 (six) hours as needed.   [DISCONTINUED] ramelteon (ROZEREM) 8 MG tablet Take by mouth.   No facility-administered encounter medications on file as of 03/29/2023.    Past Medical History:  Diagnosis Date   Asthma    Breast cancer    Cancer    Depression    Dysrhythmia    History of chicken pox    Hyperlipemia     Hypothyroidism    Scoliosis     Past Surgical History:  Procedure Laterality Date   ABDOMINAL HYSTERECTOMY     CHOLECYSTECTOMY     COLONOSCOPY WITH PROPOFOL N/A 09/01/2015   Procedure: COLONOSCOPY WITH PROPOFOL;  Surgeon: Elnita Maxwell, MD;  Location: [facility] ENDOSCOPY;  Service: Endoscopy;  Laterality: N/A;   COLONOSCOPY WITH PROPOFOL N/A 03/21/2021   Procedure: COLONOSCOPY WITH PROPOFOL;  Surgeon: Midge Minium, MD;  Location: [facility] ENDOSCOPY;  Service: Endoscopy;  Laterality: N/A;   MASTECTOMY     MYRINGOTOMY WITH TUBE PLACEMENT Right 01/24/2022   Procedure: MYRINGOTOMY WITH TUBE PLACEMENT;  Surgeon: Bud Face, MD;  Location[facility];  Service: ENT;  Laterality: Right;  pt. request later in morning 8 am   nare polypectomy Left    SPINAL FUSION     UPPER GI ENDOSCOPY      Family History  Problem Relation Age of Onset   Multiple sclerosis Mother    Prostate cancer Maternal Uncle    Colon cancer Maternal Uncle    Colon cancer Maternal Grandmother     Social History   Socioeconomic History   Marital status: Divorced    Spouse name: Not on file   Number of children: Not on file   Years of education: Not on file   Highest education level: Not on file  Occupational History   Not on file  Tobacco Use   Smoking status: Former    Types: Cigarettes   Smokeless tobacco: Never  Vaping Use   Vaping Use: Never used  Substance and Sexual Activity   Alcohol use: No    Alcohol/week: 0.0 standard drinks of alcohol   Drug use: Never   Sexual activity: Not Currently  Other Topics Concern   Not on file  Social History Narrative   Not on file   Social Determinants of Health   Financial Resource Strain: Not on file  Food Insecurity: Not on file  Transportation Needs: Not on file  Physical Activity: Not on file  Stress: Not on file  Social Connections: Not on file  Intimate Partner Violence: Not on file    Review of Systems  All other systems reviewed  and are negative.       Objective    BP 120/68 (BP Location: Left Arm, Patient Position: Sitting, Cuff Size: Normal)   Pulse 64   Temp 97.7 F (36.5 C) (Oral)   Ht 5' 8.25" (1.734 m)   Wt 144 lb 1.6 oz (65.4 kg)   SpO2 98%   BMI 21.75 kg/m   Physical Exam Vitals reviewed.  Constitutional:      Appearance: Normal appearance. She is well-groomed and normal weight.  Eyes:     Conjunctiva/sclera: Conjunctivae normal.  Neck:     Thyroid: No thyromegaly.  Cardiovascular:     Rate and Rhythm: Normal  rate and regular rhythm.     Pulses: Normal pulses.     Heart sounds: S1 normal and S2 normal.  Pulmonary:     Effort: Pulmonary effort is normal.     Breath sounds: Normal breath sounds and air entry.  Abdominal:     General: Bowel sounds are normal.  Musculoskeletal:     Right lower leg: No edema.     Left lower leg: No edema.  Neurological:     Mental Status: She is alert and oriented to person, place, and time. Mental status is at baseline.     Gait: Gait is intact.  Psychiatric:        Mood and Affect: Mood and affect normal.        Speech: Speech normal.        Behavior: Behavior normal.        Judgment: Judgment normal.     {Labs (Optional):23779}    Assessment & Plan:   Problem List Items Addressed This Visit       Unprioritized   Mitral valve prolapse   Relevant Medications   amLODipine (NORVASC) 2.5 MG tablet   Other Relevant Orders   Ambulatory referral to Cardiology   Cataracts, bilateral - Primary   Relevant Orders   Ambulatory referral to Ophthalmology   Dry eye (Chronic)   Relevant Orders   Ambulatory referral to Ophthalmology   Other Visit Diagnoses     Callus of foot       Relevant Orders   Ambulatory referral to Podiatry   Sensorineural hearing loss (SNHL) of both ears       Relevant Orders   Ambulatory referral to Audiology   Postmenopausal state       Relevant Orders   DG Bone Density       No follow-ups on file.   Karie Georges, MD

## 2023-04-01 ENCOUNTER — Ambulatory Visit (HOSPITAL_BASED_OUTPATIENT_CLINIC_OR_DEPARTMENT_OTHER)
Admission: RE | Admit: 2023-04-01 | Discharge: 2023-04-01 | Disposition: A | Discharge status: Home / Self Care | Payer: Medicare Other | Source: Ambulatory Visit | Attending: Family Medicine | Admitting: Family Medicine

## 2023-04-01 DIAGNOSIS — Z780 Asymptomatic menopausal state: Secondary | ICD-10-CM | POA: Insufficient documentation

## 2023-04-02 NOTE — Assessment & Plan Note (Signed)
Will send to ophthalmology for this and the dry eye

## 2023-04-02 NOTE — Assessment & Plan Note (Signed)
No murmurs heard on exam today, I reviewed her last ECHO in the chart, mild on the report. Will send to cardiology for surveillance, she is currently asymptomatic

## 2023-04-23 ENCOUNTER — Ambulatory Visit: Payer: Medicare Other | Admitting: Podiatry

## 2023-04-24 ENCOUNTER — Encounter: Payer: Self-pay | Admitting: Family Medicine

## 2023-04-24 DIAGNOSIS — L900 Lichen sclerosus et atrophicus: Secondary | ICD-10-CM

## 2023-04-30 ENCOUNTER — Encounter: Payer: Self-pay | Admitting: Cardiovascular Disease

## 2023-04-30 ENCOUNTER — Ambulatory Visit: Payer: Medicare Other | Attending: Cardiovascular Disease | Admitting: Cardiovascular Disease

## 2023-04-30 VITALS — BP 144/94 | HR 64 | Ht 68.25 in | Wt 146.2 lb

## 2023-04-30 DIAGNOSIS — E785 Hyperlipidemia, unspecified: Secondary | ICD-10-CM

## 2023-04-30 DIAGNOSIS — R0789 Other chest pain: Secondary | ICD-10-CM | POA: Diagnosis not present

## 2023-04-30 DIAGNOSIS — I341 Nonrheumatic mitral (valve) prolapse: Secondary | ICD-10-CM | POA: Diagnosis not present

## 2023-04-30 NOTE — Patient Instructions (Signed)
Medication Instructions:  Your physician recommends that you continue on your current medications as directed. Please refer to the Current Medication list given to you today.  *If you need a refill on your cardiac medications before your next appointment, please call your pharmacy*   Testing/Procedures: Your physician has requested that you have an echocardiogram. Echocardiography is a painless test that uses sound waves to create images of your heart. It provides your doctor with information about the size and shape of your heart and how well your heart's chambers and valves are working. This procedure takes approximately one hour. There are no restrictions for this procedure. Please do NOT wear cologne, perfume, aftershave, or lotions (deodorant is allowed). Please arrive 15 minutes prior to your appointment time. This will take place at [address] [place]   Dr. Allyson Sabal has ordered a CT coronary calcium score.   Test locations:  [facility]  [place]  [facility]  This is $99 out of pocket.   Coronary CalciumScan A coronary calcium scan is an imaging test used to look for deposits of calcium and other fatty materials (plaques) in the inner lining of the blood vessels of the heart (coronary arteries). These deposits of calcium and plaques can partly clog and narrow the coronary arteries without producing any symptoms or warning signs. This puts a person at risk for a heart attack. This test can detect these deposits before symptoms develop. Tell a health care provider about: Any allergies you have. All medicines you are taking, including vitamins, herbs, eye drops, creams, and over-the-counter medicines. Any problems you or family members have had with anesthetic medicines. Any blood disorders you have. Any surgeries you have had. Any medical conditions you have. Whether you are pregnant or may be  pregnant. What are the risks? Generally, this is a safe procedure. However, problems may occur, including: Harm to a pregnant woman and her unborn baby. This test involves the use of radiation. Radiation exposure can be dangerous to a pregnant woman and her unborn baby. If you are pregnant, you generally should not have this procedure done. Slight increase in the risk of cancer. This is because of the radiation involved in the test. What happens before the procedure? No preparation is needed for this procedure. What happens during the procedure? You will undress and remove any jewelry around your neck or chest. You will put on a hospital gown. Sticky electrodes will be placed on your chest. The electrodes will be connected to an electrocardiogram (ECG) machine to record a tracing of the electrical activity of your heart. A CT scanner will take pictures of your heart. During this time, you will be asked to lie still and hold your breath for 2-3 seconds while a picture of your heart is being taken. The procedure may vary among health care providers and hospitals. What happens after the procedure? You can get dressed. You can return to your normal activities. It is up to you to get the results of your test. Ask your health care provider, or the department that is doing the test, when your results will be ready. Summary A coronary calcium scan is an imaging test used to look for deposits of calcium and other fatty materials (plaques) in the inner lining of the blood vessels of the heart (coronary arteries). Generally, this is a safe procedure. Tell your health care provider if you are pregnant or may be pregnant. No  preparation is needed for this procedure. A CT scanner will take pictures of your heart. You can return to your normal activities after the scan is done. This information is not intended to replace advice given to you by your health care provider. Make sure you discuss any questions  you have with your health care provider. Document Released: 06/07/2008 Document Revised: 10/29/2016 Document Reviewed: 10/29/2016 Elsevier Interactive Patient Education  2017 [employer].     Follow-Up: At [facility], you and your health needs are our priority.  As part of our continuing mission to provide you with exceptional heart care, we have created designated Provider Care Teams.  These Care Teams include your primary Cardiologist (physician) and Advanced Practice Providers (APPs -  Physician Assistants and Nurse Practitioners) who all work together to provide you with the care you need, when you need it.  We recommend signing up for the patient portal called "MyChart".  Sign up information is provided on this After Visit Summary.  MyChart is used to connect with patients for Virtual Visits (Telemedicine).  Patients are able to view lab/test results, encounter notes, upcoming appointments, etc.  Non-urgent messages can be sent to your provider as well.   To learn more about what you can do with MyChart, go to [email or web address].    Your next appointment:   3 month(s)  Provider:   Nanetta Batty, MD   Other Instructions Dr. Allyson Sabal has requested that you schedule an appointment with one of our clinical pharmacists for a blood pressure check appointment within the next 4 weeks.  If you monitor your blood pressure (BP) at home, please bring your BP cuff and your BP readings with you to this appointment  HOW TO TAKE YOUR BLOOD PRESSURE: Rest 5 minutes before taking your blood pressure. Don't smoke or drink caffeinated beverages for at least 30 minutes before. Take your blood pressure before (not after) you eat. Sit comfortably with your back supported and both feet on the floor (don't cross your legs). Elevate your arm to heart level on a table or a desk. Use the proper sized cuff. It should fit smoothly and snugly around your bare upper arm. There should be enough room  to slip a fingertip under the cuff. The bottom edge of the cuff should be 1 inch above the crease of the elbow. Ideally, take 3 measurements at one sitting and record the average.

## 2023-04-30 NOTE — Assessment & Plan Note (Signed)
History of mitral valve prolapse as a diagnosis from young adulthood.  I do hear a soft late systolic click.  I am going to get a 2D echo to further evaluate.

## 2023-04-30 NOTE — Assessment & Plan Note (Signed)
History of hyperlipidemia on Zetia with lipid profile performed 02/06/2023 revealing total cholesterol 243, LDL 125 and HDL of 101.

## 2023-04-30 NOTE — Assessment & Plan Note (Signed)
Patient has daily fleeting/pinching atypical chest pain that does not sound ischemically mediated.  I am going to get a coronary calcium score to further evaluate.

## 2023-04-30 NOTE — Progress Notes (Signed)
04/30/2023 Tanya Snyder   06-05-1951  [id number]  Primary Physician Karie Georges, MD Primary Cardiologist: Runell Gess MD Nicholes Calamity, [place]  HPI:  Tanya Snyder is a 72 y.o. thin-appearing divorced Caucasian female mother of 2 children, grandmother of 3 grandchildren who was referred by her PCP, Dr. Nira Conn, for cardiovascular valuation because of mitral valve prolapse and atypical chest pain.  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  She recently sent for was from [facility], moved to [place] and ultimately to [place] to be closer to family.  Risk factors include treated hypertension and hyperlipidemia.  There is no family history of heart disease.  She is never had a heart attack or stroke.  She does get some shortness of breath and atypical chest pain.  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  She is a breast cancer survivor and has had a mastectomy in the past.   Current Meds  Medication Sig   albuterol (VENTOLIN HFA) 108 (90 Base) MCG/ACT inhaler SMARTSIG:1-2 Puff(s) Via Inhaler Every 6 Hours PRN   aluminum hydroxide-magnesium carbonate (GAVISCON) 95-358 MG/15ML SUSP Take 15 mLs by mouth.   amLODipine (NORVASC) 2.5 MG tablet Take 2.5 mg by mouth daily.   amphetamine-dextroamphetamine (ADDERALL) 10 MG tablet Take 10 mg by mouth every morning.   ascorbic acid (VITAMIN C) 1000 MG tablet Take 1,000 mg by mouth daily.   calcium carbonate (TUMS - DOSED IN MG ELEMENTAL CALCIUM) 500 MG chewable tablet Chew 1 tablet by mouth daily.   clobetasol ointment (TEMOVATE) 0.05 % Apply topically daily.   clonazePAM (KLONOPIN) 0.5 MG tablet Take 0.5 mg by mouth 2 (two) times daily as needed for anxiety.   cyclobenzaprine (FLEXERIL) 5 MG tablet Take 5 mg by mouth at bedtime as needed.    D-MANNOSE PO Take by mouth.   ezetimibe (ZETIA) 10 MG tablet Take 10 mg by mouth daily.   gabapentin (NEURONTIN) 100 MG capsule Take 100-300 mg by mouth at bedtime.   levothyroxine (SYNTHROID) 125 MCG tablet Take 125 mcg by mouth daily.   Methenamine-Sodium Salicylate 162-162.5 MG TABS Take by mouth.   nitrofurantoin (MACRODANTIN) 50 MG capsule Take 50 mg by mouth daily at 6 PM.   ofloxacin (OCUFLOX) 0.3 % ophthalmic solution Place 1 drop into the left eye 4 (four) times daily.   Plecanatide (TRULANCE) 3 MG TABS Take 1 tablet by mouth daily.   triamcinolone (NASACORT) 55 MCG/ACT AERO nasal inhaler Place into the nose.   triamcinolone cream (KENALOG) 0.5 % Apply topically 2 (two) times daily.   WIXELA INHUB 250-50 MCG/DOSE AEPB 1 puff 2 (two) times daily.   zolpidem (AMBIEN) 5 MG tablet Take 5 mg by mouth at bedtime as needed.     Allergies  Allergen Reactions   Lortab [Hydrocodone-Acetaminophen] Other (See Comments)    Nausea, headache    Social History   Socioeconomic History   Marital status: Divorced    Spouse name: Not on file   Number of children: Not on file   Years of education: Not on file   Highest education level: Not on file  Occupational History   Not on file  Tobacco Use   Smoking status: Former    Types: Cigarettes   Smokeless tobacco: Never  Vaping Use   Vaping Use: Never used  Substance and Sexual Activity   Alcohol use: No    Alcohol/week: 0.0 standard drinks of alcohol   Drug use: Never   Sexual activity: Not Currently  Other Topics Concern   Not on file  Social History Narrative   Not on file   Social Determinants of Health   Financial Resource Strain: Not on file  Food Insecurity: Not on file  Transportation Needs: Not on file  Physical Activity: Not on file  Stress: Not on file  Social Connections: Not on file  Intimate Partner Violence: Not on file     Review of Systems: General: negative for chills, fever, night sweats or weight  changes.  Cardiovascular: negative for chest pain, dyspnea on exertion, edema, orthopnea, palpitations, paroxysmal nocturnal dyspnea or shortness of breath Dermatological: negative for rash Respiratory: negative for cough or wheezing Urologic: negative for hematuria Abdominal: negative for nausea, vomiting, diarrhea, bright red blood per rectum, melena, or hematemesis Neurologic: negative for visual changes, syncope, or dizziness All other systems reviewed and are otherwise negative except as noted above.    Blood pressure (!) 144/94, pulse 64, height 5' 8.25" (1.734 m), weight 146 lb 3.2 oz (66.3 kg), SpO2 97 %.  General appearance: alert and no distress Neck: no adenopathy, no carotid bruit, no JVD, supple, symmetrical, trachea midline, and thyroid not enlarged, symmetric, no tenderness/mass/nodules Lungs: clear to auscultation bilaterally Heart: No longer going to work with back to disc etc. room although I will ask her Extremities: extremities normal, atraumatic, no cyanosis or edema Pulses: 2+ and symmetric Skin: Skin color, texture, turgor normal. No rashes or lesions Neurologic: Grossly normal  EKG sinus rhythm at 64 with nonspecific ST and T wave changes.  Personally reviewed this EKG.  ASSESSMENT AND PLAN:   Hyperlipidemia History of hyperlipidemia on Zetia with lipid profile performed 02/06/2023 revealing total cholesterol 243, LDL 125 and HDL of 101.  Mitral valve prolapse History of mitral valve prolapse as a diagnosis from young adulthood.  I do hear a soft late systolic click.  I am going to get a 2D echo to further evaluate.  Atypical chest pain Patient has daily fleeting/pinching atypical chest pain that does not sound ischemically mediated.  I am going to get a coronary calcium score to further evaluate.     Runell Gess MD FACP,FACC,FAHA, [facility] 04/30/2023 3:29 PM

## 2023-05-08 ENCOUNTER — Telehealth: Payer: Self-pay | Admitting: Family Medicine

## 2023-05-08 NOTE — Telephone Encounter (Signed)
Called patient to schedule Medicare Annual Wellness Visit (AWV). No voicemail available to leave a message.  Last date of AWV: awvi 08/24/16 per [facility]   Please schedule an appointment at any time with [facility] or [employer] .  If any questions, please contact me at [phone].  Thank you ,  Rudell Cobb AWV direct phone # [phone]   Tried calling patient   patient could not hear on phone  sent my chart message

## 2023-05-09 ENCOUNTER — Telehealth: Payer: Self-pay | Admitting: Family Medicine

## 2023-05-09 NOTE — Telephone Encounter (Signed)
Patient came in asking for a refill of gabapentin to be sent into:  [facility] #[id number] [phone] fax

## 2023-05-15 ENCOUNTER — Other Ambulatory Visit (HOSPITAL_COMMUNITY): Payer: Self-pay

## 2023-05-15 ENCOUNTER — Other Ambulatory Visit: Payer: Self-pay

## 2023-05-15 ENCOUNTER — Other Ambulatory Visit: Payer: Self-pay | Admitting: Family Medicine

## 2023-05-15 DIAGNOSIS — M545 Low back pain, unspecified: Secondary | ICD-10-CM

## 2023-05-15 MED ORDER — GABAPENTIN 100 MG PO CAPS
100.0000 mg | ORAL_CAPSULE | Freq: Every day | ORAL | 1 refills | Status: DC
Start: 2023-05-15 — End: 2023-08-27
  Filled 2023-05-15: qty 90, 30d supply, fill #0
  Filled 2023-06-12: qty 90, 30d supply, fill #1

## 2023-05-24 ENCOUNTER — Other Ambulatory Visit: Payer: Self-pay

## 2023-06-04 ENCOUNTER — Ambulatory Visit (HOSPITAL_BASED_OUTPATIENT_CLINIC_OR_DEPARTMENT_OTHER)
Admission: RE | Admit: 2023-06-04 | Discharge: 2023-06-04 | Disposition: A | Discharge status: Home / Self Care | Payer: Medicare Other | Source: Ambulatory Visit | Attending: Cardiovascular Disease | Admitting: Cardiovascular Disease

## 2023-06-04 DIAGNOSIS — E785 Hyperlipidemia, unspecified: Secondary | ICD-10-CM | POA: Insufficient documentation

## 2023-06-04 DIAGNOSIS — I341 Nonrheumatic mitral (valve) prolapse: Secondary | ICD-10-CM | POA: Insufficient documentation

## 2023-06-04 DIAGNOSIS — R0789 Other chest pain: Secondary | ICD-10-CM | POA: Insufficient documentation

## 2023-06-05 ENCOUNTER — Other Ambulatory Visit (HOSPITAL_BASED_OUTPATIENT_CLINIC_OR_DEPARTMENT_OTHER): Payer: Medicare Other

## 2023-06-06 ENCOUNTER — Ambulatory Visit (HOSPITAL_COMMUNITY): Payer: Medicare Other | Attending: Cardiovascular Disease

## 2023-06-06 DIAGNOSIS — R0789 Other chest pain: Secondary | ICD-10-CM | POA: Diagnosis not present

## 2023-06-06 DIAGNOSIS — E785 Hyperlipidemia, unspecified: Secondary | ICD-10-CM | POA: Insufficient documentation

## 2023-06-06 DIAGNOSIS — I341 Nonrheumatic mitral (valve) prolapse: Secondary | ICD-10-CM | POA: Diagnosis not present

## 2023-06-06 LAB — ECHOCARDIOGRAM COMPLETE
Area-P 1/2: 3.65 cm2
MV M vel: 3.7 m/s
MV Peak grad: 54.8 mmHg
S' Lateral: 2.8 cm

## 2023-06-11 ENCOUNTER — Encounter: Payer: Self-pay | Admitting: Pharmacist

## 2023-06-11 ENCOUNTER — Telehealth: Payer: Self-pay

## 2023-06-11 ENCOUNTER — Other Ambulatory Visit (HOSPITAL_COMMUNITY): Payer: Self-pay

## 2023-06-11 ENCOUNTER — Ambulatory Visit: Payer: Medicare Other | Attending: Cardiovascular Disease | Admitting: Pharmacist

## 2023-06-11 ENCOUNTER — Telehealth: Payer: Self-pay | Admitting: Pharmacist

## 2023-06-11 DIAGNOSIS — E785 Hyperlipidemia, unspecified: Secondary | ICD-10-CM

## 2023-06-11 DIAGNOSIS — R931 Abnormal findings on diagnostic imaging of heart and coronary circulation: Secondary | ICD-10-CM

## 2023-06-11 DIAGNOSIS — Z5309 Procedure and treatment not carried out because of other contraindication: Secondary | ICD-10-CM | POA: Diagnosis not present

## 2023-06-11 MED ORDER — REPATHA SURECLICK 140 MG/ML ~~LOC~~ SOAJ
1.0000 mL | SUBCUTANEOUS | 0 refills | Status: DC
Start: 2023-06-11 — End: 2023-06-12

## 2023-06-11 NOTE — Telephone Encounter (Signed)
Please complete PA for Repatha 140

## 2023-06-11 NOTE — Patient Instructions (Addendum)
It was nice meeting you today  We would like your LDL (bad cholesterol) to be less than 70  Please continue your ezetimibe 10mg  once daily and restart your Metamucil  The medication we discussed today was called Repatha which you would take once every 2 weeks  I will provide you a sample as well as complete the prior authorization  Once you start the medication we will recheck your fasting lipid panel in about 2-3 months  Please let me know if you have any questions  Laural Golden, PharmD, BCACP, CDCES, CPP [address] [place], [place], [address] Phone: [phone], Fax: [phone]

## 2023-06-11 NOTE — Telephone Encounter (Signed)
Pharmacy Patient Advocate Encounter   Received notification from [facility] MEDICARE that prior authorization for REPATHA 140 MG/ML INJ is needed.    PA submitted on 06/11/23 Key [id number] Status is pending  Haze Rushing, CPhT Pharmacy Patient Advocate Specialist Direct Number: [phone] Fax: [phone]

## 2023-06-11 NOTE — Progress Notes (Signed)
Patient ID: Tanya Snyder                 DOB: 1951/09/13                    MRN: [id number]     HPI: Tanya Snyder is a 72 y.o. female patient referred to lipid clinic by Dr Allyson Sabal. PMH is significant for mitral valve prolapse, ADD, HLD, breast cancer, depression, and elevated coronary calcium score.  Currently managed on ezetimibe 10mg  daily.  Patient presents today to discuss HLD/CAD. Retired Psychologist, occupational. Has lived in [place] and [place]. While in [place] was started on a statin but does not remember name. Felt poorly for multiple months until she was seen again. On lab work CK had increased to 3408 and AST increased to 99. Confirmed in Care Everywhere. Patient hesitant to try any further statins.   When she moved back to [place] she saw a PCP in [place] who ran a lipid panel. At the time she had been taking Metamucil and LDL and TC had reduced.  Current Medications: Zetia 10mg  daily  Intolerances:  Statins  Risk Factors:  CAD CAC  LDL goal: <70  Labs: TC  243, Trigs 83, HDL 101.1, LDL 125 (02/06/23 on Zetia 10mg )  Imaging: Coronary calcium score of 35.4. This was 53rd percentile for age-,race-, and sex-matched controls.  Past Medical History:  Diagnosis Date   Asthma    Breast cancer (HCC)    Cancer (HCC)    Depression    Dysrhythmia    History of chicken pox    Hyperlipemia    Hypothyroidism    Scoliosis     Current Outpatient Medications on File Prior to Visit  Medication Sig Dispense Refill   albuterol (VENTOLIN HFA) 108 (90 Base) MCG/ACT inhaler SMARTSIG:1-2 Puff(s) Via Inhaler Every 6 Hours PRN     aluminum hydroxide-magnesium carbonate (GAVISCON) 95-358 MG/15ML SUSP Take 15 mLs by mouth.     amLODipine (NORVASC) 2.5 MG tablet Take 2.5 mg by mouth daily.     amphetamine-dextroamphetamine (ADDERALL) 10 MG tablet Take 10 mg by mouth every morning.     ascorbic acid (VITAMIN C) 1000 MG tablet Take 1,000 mg by mouth daily.     calcium carbonate  (TUMS - DOSED IN MG ELEMENTAL CALCIUM) 500 MG chewable tablet Chew 1 tablet by mouth daily.     clobetasol ointment (TEMOVATE) 0.05 % Apply topically daily.     clonazePAM (KLONOPIN) 0.5 MG tablet Take 0.5 mg by mouth 2 (two) times daily as needed for anxiety.     cyclobenzaprine (FLEXERIL) 5 MG tablet Take 5 mg by mouth at bedtime as needed.     D-MANNOSE PO Take by mouth.     ezetimibe (ZETIA) 10 MG tablet Take 10 mg by mouth daily.     gabapentin (NEURONTIN) 100 MG capsule Take 1-3 capsules (100-300 mg total) by mouth at bedtime. 90 capsule 1   hydrOXYzine (ATARAX) 25 MG tablet Take 25 mg by mouth daily.     levothyroxine (SYNTHROID) 125 MCG tablet Take 125 mcg by mouth daily.     Methenamine-Sodium Salicylate 162-162.5 MG TABS Take by mouth.     nitrofurantoin (MACRODANTIN) 50 MG capsule Take 50 mg by mouth daily at 6 PM.     Plecanatide (TRULANCE) 3 MG TABS Take 1 tablet by mouth daily. 30 tablet 1   triamcinolone (NASACORT) 55 MCG/ACT AERO nasal inhaler Place into the nose.  WIXELA INHUB 250-50 MCG/DOSE AEPB 1 puff 2 (two) times daily.     zolpidem (AMBIEN) 5 MG tablet Take 5 mg by mouth at bedtime as needed.     No current facility-administered medications on file prior to visit.    Allergies  Allergen Reactions   Lortab [Hydrocodone-Acetaminophen] Other (See Comments)    Nausea, headache    Assessment/Plan:  1. Hyperlipidemia - Patient last LDL 125 which is above goal of <70. Will not retry statins due to CK elevation. Added to contranidication list. Recommend patient restart Metamucil since it was beneficial for her previously.  Advised there were other non-statin therapies that cold be tried including PCSK9i. Using demo pen, educated patient on mechanism of action, storage, site selection, administration, and possible adverse effects. Patient requests sample med to make sure she can tolerate. Will complete PA regardless and patient will start if she tolerates sample. Recheck  lipid panel in 2-3 months. Patient also requests lab work for blood typing. Recommend asking PCP.  Continue zetia 10mg  daily Start Repatha 140mg  q 2 weeks Recheck lipid panel in 2-3 months  Laural Golden, PharmD, BCACP, CDCES, CPP [address], [address] [place], [place], [address] Phone: [phone], Fax: [phone]

## 2023-06-11 NOTE — Telephone Encounter (Signed)
PA initiated, please see separate encounter for updates on determination. (I will route you back in once a decision has been made)  Deklin Bieler, CPhT Pharmacy Patient Advocate Specialist Direct Number: [phone] Fax: [phone]  

## 2023-06-12 ENCOUNTER — Other Ambulatory Visit (HOSPITAL_BASED_OUTPATIENT_CLINIC_OR_DEPARTMENT_OTHER): Payer: Self-pay

## 2023-06-12 ENCOUNTER — Other Ambulatory Visit (HOSPITAL_COMMUNITY): Payer: Self-pay

## 2023-06-12 ENCOUNTER — Encounter: Payer: Self-pay | Admitting: Pharmacist

## 2023-06-12 ENCOUNTER — Other Ambulatory Visit: Payer: Self-pay

## 2023-06-12 DIAGNOSIS — R931 Abnormal findings on diagnostic imaging of heart and coronary circulation: Secondary | ICD-10-CM | POA: Insufficient documentation

## 2023-06-12 MED ORDER — DOBUTAMINE INFUSION FOR EP/ECHO/NUC (1000 MCG/ML)
INTRAVENOUS | Status: AC
  Filled 2023-06-12: qty 250

## 2023-06-12 MED ORDER — REPATHA SURECLICK 140 MG/ML ~~LOC~~ SOAJ
1.0000 mL | SUBCUTANEOUS | 1 refills | Status: DC
Start: 2023-06-12 — End: 2024-03-27

## 2023-06-12 MED ORDER — HEPARIN SODIUM (PORCINE) 1000 UNIT/ML IJ SOLN
INTRAMUSCULAR | Status: AC
  Filled 2023-06-12: qty 10

## 2023-06-12 NOTE — Telephone Encounter (Signed)
Pharmacy Patient Advocate Encounter  Prior Authorization for Tanya Snyder has been approved.    PA# [id number] Effective dates: 06/11/23 through 12/11/23  Haze Rushing, CPhT Pharmacy Patient Advocate Specialist Direct Number: [phone] Fax: [phone]

## 2023-06-12 NOTE — Addendum Note (Signed)
Addended by: Cheree Ditto on: 06/12/2023 10:12 AM   Modules accepted: Orders

## 2023-06-12 NOTE — Telephone Encounter (Signed)
Spoke with patient who is aware of approval. Injected herself with sample last night. Was not sure she did it correctly but by her description it sounded like she did. Placing order for pharmacy and 3 month lab recheck.

## 2023-06-15 ENCOUNTER — Encounter: Payer: Self-pay | Admitting: Cardiovascular Disease

## 2023-06-27 ENCOUNTER — Encounter: Payer: Self-pay | Admitting: Family Medicine

## 2023-07-01 ENCOUNTER — Other Ambulatory Visit (HOSPITAL_COMMUNITY): Payer: Self-pay

## 2023-07-01 ENCOUNTER — Telehealth: Payer: Self-pay | Admitting: Family Medicine

## 2023-07-01 ENCOUNTER — Encounter: Payer: Self-pay | Admitting: Family Medicine

## 2023-07-01 NOTE — Telephone Encounter (Signed)
Pt has been referred to a Urologist in [place], but states that is too far.   Pt would like to request another referral to a provider in [place] [employer].   Please advise.

## 2023-07-01 NOTE — Telephone Encounter (Signed)
Spoke with the patient and advised she contact the provider's office that referred her to the urologist for assistance as this was not entered by PCP.

## 2023-07-12 ENCOUNTER — Ambulatory Visit (INDEPENDENT_AMBULATORY_CARE_PROVIDER_SITE_OTHER): Payer: Medicare Other | Admitting: Family Medicine

## 2023-07-12 ENCOUNTER — Encounter: Payer: Self-pay | Admitting: Family Medicine

## 2023-07-12 VITALS — BP 120/62 | HR 73 | Temp 98.5°F | Ht 68.25 in | Wt 145.2 lb

## 2023-07-12 DIAGNOSIS — G720 Drug-induced myopathy: Secondary | ICD-10-CM | POA: Diagnosis not present

## 2023-07-12 DIAGNOSIS — E041 Nontoxic single thyroid nodule: Secondary | ICD-10-CM | POA: Diagnosis not present

## 2023-07-12 DIAGNOSIS — R300 Dysuria: Secondary | ICD-10-CM | POA: Diagnosis not present

## 2023-07-12 DIAGNOSIS — C50911 Malignant neoplasm of unspecified site of right female breast: Secondary | ICD-10-CM

## 2023-07-12 DIAGNOSIS — T466X5A Adverse effect of antihyperlipidemic and antiarteriosclerotic drugs, initial encounter: Secondary | ICD-10-CM

## 2023-07-12 DIAGNOSIS — Z87440 Personal history of urinary (tract) infections: Secondary | ICD-10-CM | POA: Diagnosis not present

## 2023-07-12 LAB — POC URINALSYSI DIPSTICK (AUTOMATED)
Bilirubin, UA: NEGATIVE
Blood, UA: NEGATIVE
Glucose, UA: NEGATIVE
Ketones, UA: NEGATIVE
Leukocytes, UA: NEGATIVE
Nitrite, UA: NEGATIVE
Protein, UA: NEGATIVE
Spec Grav, UA: <=1.005 — AB (ref 1.010–1.025)
Urobilinogen, UA: 0.2 E.U./dL
pH, UA: 6 (ref 5.0–8.0)

## 2023-07-12 NOTE — Progress Notes (Unsigned)
Established Patient Office Visit  Subjective   Patient ID: Tanya Snyder, female    DOB: 1951-07-09  Age: 72 y.o. MRN: [id number]  Chief Complaint  Patient presents with  . Urinary Tract Infection    Patient complains of recurrent UTIs for the past 10 years, seen at an urgent care on July 4th and diagnosed with a UTI, given an antibiotic    Patient states that she thinks she has another UTI. States that this came on rather suddenly on the 4th of July. She went to urgent care, was given antibiotics and diflucan which did help some of her symptoms.   Saw her GYN last week. She was given HRT by the GYN. States that she is nervous about HRT, states that she thinks that it is helping some, but she was told by the GYN that she needed to see  urologist. She reports some mild pelvic cramping, no nausea, no fever or chills.   Pt is also requesting blood work today before her appointment with her other provider. Orders placed  Patinet reports a history of thyroid nodules, states that she had to have one removed when she was 16. Last TSH was normal, labs reviewed. She is concerned that she needs a repeat US to monitor the thyroid.    Current Outpatient Medications  Medication Instructions  . albuterol (VENTOLIN HFA) 108 (90 Base) MCG/ACT inhaler SMARTSIG:1-2 Puff(s) Via Inhaler Every 6 Hours PRN  . aluminum hydroxide-magnesium carbonate (GAVISCON) 95-358 MG/15ML SUSP 15 mLs, Oral  . amLODipine (NORVASC) 2.5 mg, Oral, Daily  . amphetamine-dextroamphetamine (ADDERALL) 10 MG tablet 10 mg, Oral, Every morning  . ascorbic acid (VITAMIN C) 1,000 mg, Oral, Daily  . calcium carbonate (TUMS - DOSED IN MG ELEMENTAL CALCIUM) 500 MG chewable tablet 1 tablet, Oral, Daily  . clobetasol ointment (TEMOVATE) 0.05 % Topical, Daily  . clonazePAM (KLONOPIN) 0.5 mg, Oral, 2 times daily PRN  . cyclobenzaprine (FLEXERIL) 5 mg, Oral, At bedtime PRN  . D-MANNOSE PO Oral  . ezetimibe (ZETIA) 10 mg, Oral, Daily   . gabapentin (NEURONTIN) 100-300 mg, Oral, Daily at bedtime  . hydrOXYzine (ATARAX) 25 mg, Oral, Daily  . levothyroxine (SYNTHROID) 125 mcg, Oral, Daily  . Methenamine-Sodium Salicylate 162-162.5 MG TABS Oral  . nitrofurantoin (MACRODANTIN) 50 mg, Oral, Daily-1800  . Repatha SureClick 140 mg, Subcutaneous, Every 14 days  . triamcinolone (NASACORT) 55 MCG/ACT AERO nasal inhaler Nasal  . WIXELA INHUB 250-50 MCG/DOSE AEPB 1 puff, 2 times daily  . zolpidem (AMBIEN) 5 mg, Oral, At bedtime PRN    {History (Optional):23778}  Review of Systems  All other systems reviewed and are negative.     Objective:     BP 120/62 (BP Location: Left Arm, Patient Position: Sitting, Cuff Size: Normal)   Pulse 73   Temp 98.5 F (36.9 C) (Oral)   Ht 5' 8.25" (1.734 m)   Wt 145 lb 3.2 oz (65.9 kg)   SpO2 96%   BMI 21.92 kg/m  {Vitals History (Optional):23777}  Physical Exam Vitals reviewed.  Constitutional:      Appearance: Normal appearance. She is well-groomed and normal weight.  Eyes:     Conjunctiva/sclera: Conjunctivae normal.  Neck:     Thyroid: No thyromegaly.  Cardiovascular:     Rate and Rhythm: Normal rate and regular rhythm.     Heart sounds: Normal heart sounds, S1 normal and S2 normal.  Pulmonary:     Effort: Pulmonary effort is normal.     Breath sounds:  Normal air entry.  Abdominal:     General: There is no distension.     Tenderness: There is no right CVA tenderness or left CVA tenderness.  Neurological:     Mental Status: She is alert and oriented to person, place, and time. Mental status is at baseline.     Gait: Gait is intact.  Psychiatric:        Mood and Affect: Mood and affect normal.        Speech: Speech normal.        Behavior: Behavior normal.        Judgment: Judgment normal.     Results for orders placed or performed in visit on 07/12/23  POCT Urinalysis Dipstick (Automated)  Result Value Ref Range   Color, UA yellow    Clarity, UA clear     Glucose, UA Negative Negative   Bilirubin, UA negative    Ketones, UA negative    Spec Grav, UA <=1.005 (A) 1.010 - 1.025   Blood, UA negative    pH, UA 6.0 5.0 - 8.0   Protein, UA Negative Negative   Urobilinogen, UA 0.2 0.2 or 1.0 E.U./dL   Nitrite, UA negative    Leukocytes, UA Negative Negative    {Labs (Optional):23779}  The ASCVD Risk score (Arnett DK, et al., 2019) failed to calculate for the following reasons:   Cannot find a previous HDL lab   Cannot find a previous total cholesterol lab    Assessment & Plan:  Dysuria -     POCT Urinalysis Dipstick (Automated) -     CBC with Differential/Platelet; Future -     Comprehensive metabolic panel; Future  History of recurrent UTIs -     Ambulatory referral to Urology  Statin myopathy  Thyroid nodule -     US THYROID; Future  Malignant neoplasm of right female breast, unspecified estrogen receptor status, unspecified site of breast (HCC) -     CA 125; Future     No follow-ups on file.    Karie Georges, MD

## 2023-07-16 ENCOUNTER — Ambulatory Visit: Payer: Medicare Other | Admitting: Obstetrics and Gynecology

## 2023-07-16 ENCOUNTER — Telehealth: Payer: Self-pay | Admitting: Family Medicine

## 2023-07-16 NOTE — Telephone Encounter (Signed)
Pt requesting US Thyroid  [id number] be redirected to [facility] if possible. She is new to [facility] and can get to [facility] with no issue.

## 2023-07-19 NOTE — Telephone Encounter (Signed)
Rerouted

## 2023-07-31 ENCOUNTER — Ambulatory Visit (INDEPENDENT_AMBULATORY_CARE_PROVIDER_SITE_OTHER): Payer: Medicare Other

## 2023-07-31 ENCOUNTER — Other Ambulatory Visit (INDEPENDENT_AMBULATORY_CARE_PROVIDER_SITE_OTHER): Payer: Medicare Other

## 2023-07-31 VITALS — BP 120/62 | HR 65 | Temp 98.2°F | Ht 68.25 in | Wt 146.9 lb

## 2023-07-31 DIAGNOSIS — R300 Dysuria: Secondary | ICD-10-CM | POA: Diagnosis not present

## 2023-07-31 DIAGNOSIS — Z0000 Encounter for general adult medical examination without abnormal findings: Secondary | ICD-10-CM

## 2023-07-31 DIAGNOSIS — C50911 Malignant neoplasm of unspecified site of right female breast: Secondary | ICD-10-CM

## 2023-07-31 LAB — COMPREHENSIVE METABOLIC PANEL
ALT: 21 U/L (ref 0–35)
AST: 25 U/L (ref 0–37)
Albumin: 4.7 g/dL (ref 3.5–5.2)
Alkaline Phosphatase: 61 U/L (ref 39–117)
BUN: 13 mg/dL (ref 6–23)
CO2: 28 mEq/L (ref 19–32)
Calcium: 9.9 mg/dL (ref 8.4–10.5)
Chloride: 100 mEq/L (ref 96–112)
Creatinine, Ser: 0.67 mg/dL (ref 0.40–1.20)
GFR: 87.04 mL/min (ref >60.00)
Glucose, Bld: 91 mg/dL (ref 70–99)
Potassium: 4.6 mEq/L (ref 3.5–5.1)
Sodium: 136 mEq/L (ref 135–145)
Total Bilirubin: 0.5 mg/dL (ref 0.2–1.2)
Total Protein: 7.3 g/dL (ref 6.0–8.3)

## 2023-07-31 LAB — CBC WITH DIFFERENTIAL/PLATELET
Basophils Absolute: 0 10*3/uL (ref 0.0–0.1)
Basophils Relative: 1.3 % (ref 0.0–3.0)
Eosinophils Absolute: 0.2 10*3/uL (ref 0.0–0.7)
Eosinophils Relative: 5.2 % — ABNORMAL HIGH (ref 0.0–5.0)
HCT: 39.9 % (ref 36.0–46.0)
Hemoglobin: 13.3 g/dL (ref 12.0–15.0)
Lymphocytes Relative: 35.6 % (ref 12.0–46.0)
Lymphs Abs: 1.3 10*3/uL (ref 0.7–4.0)
MCHC: 33.4 g/dL (ref 30.0–36.0)
MCV: 93.2 fl (ref 78.0–100.0)
Monocytes Absolute: 0.4 10*3/uL (ref 0.1–1.0)
Monocytes Relative: 10 % (ref 3.0–12.0)
Neutro Abs: 1.8 10*3/uL (ref 1.4–7.7)
Neutrophils Relative %: 47.9 % (ref 43.0–77.0)
Platelets: 312 10*3/uL (ref 150.0–400.0)
RBC: 4.28 Mil/uL (ref 3.87–5.11)
RDW: 13 % (ref 11.5–15.5)
WBC: 3.7 10*3/uL — ABNORMAL LOW (ref 4.0–10.5)

## 2023-07-31 NOTE — Patient Instructions (Addendum)
Tanya Snyder , Thank you for taking time to come for your Medicare Wellness Visit. I appreciate your ongoing commitment to your health goals. Please review the following plan we discussed and let me know if I can assist you in the future.   Referrals/Orders/Follow-Ups/Clinician Recommendations:   This is a list of the screening recommended for you and due dates:  Health Maintenance  Topic Date Due   Pneumonia Vaccine (2 of 2 - PPSV23 or PCV20) 11/10/2020   Flu Shot  07/25/2023   COVID-19 Vaccine (6 - 2023-24 season) 03/24/2024*   Hepatitis C Screening  03/28/2024*   Medicare Annual Wellness Visit  07/30/2024   DTaP/Tdap/Td vaccine (3 - Td or Tdap) 08/15/2027   Colon Cancer Screening  03/21/2028   DEXA scan (bone density measurement)  Completed   Zoster (Shingles) Vaccine  Completed   HPV Vaccine  Aged Out   Mammogram  Discontinued  *Topic was postponed. The date shown is not the original due date.    Advanced directives: (Copy Requested) Please bring a copy of your health care power of attorney and living will to the office to be added to your chart at your convenience.  Next Medicare Annual Wellness Visit scheduled for next year: Yes  Preventive Care 73 Years and Older, Female Preventive care refers to lifestyle choices and visits with your health care provider that can promote health and wellness. What does preventive care include? A yearly physical exam. This is also called an annual well check. Dental exams once or twice a year. Routine eye exams. Ask your health care provider how often you should have your eyes checked. Personal lifestyle choices, including: Daily care of your teeth and gums. Regular physical activity. Eating a healthy diet. Avoiding tobacco and drug use. Limiting alcohol use. Practicing safe sex. Taking low-dose aspirin every day. Taking vitamin and mineral supplements as recommended by your health care provider. What happens during an annual well  check? The services and screenings done by your health care provider during your annual well check will depend on your age, overall health, lifestyle risk factors, and family history of disease. Counseling  Your health care provider may ask you questions about your: Alcohol use. Tobacco use. Drug use. Emotional well-being. Home and relationship well-being. Sexual activity. Eating habits. History of falls. Memory and ability to understand (cognition). Work and work Astronomer. Reproductive health. Screening  You may have the following tests or measurements: Height, weight, and BMI. Blood pressure. Lipid and cholesterol levels. These may be checked every 5 years, or more frequently if you are over 68 years old. Skin check. Lung cancer screening. You may have this screening every year starting at age 55 if you have a 30-pack-year history of smoking and currently smoke or have quit within the past 15 years. Fecal occult blood test (FOBT) of the stool. You may have this test every year starting at age 65. Flexible sigmoidoscopy or colonoscopy. You may have a sigmoidoscopy every 5 years or a colonoscopy every 10 years starting at age 12. Hepatitis C blood test. Hepatitis B blood test. Sexually transmitted disease (STD) testing. Diabetes screening. This is done by checking your blood sugar (glucose) after you have not eaten for a while (fasting). You may have this done every 1-3 years. Bone density scan. This is done to screen for osteoporosis. You may have this done starting at age 50. Mammogram. This may be done every 1-2 years. Talk to your health care provider about how often you should have  regular mammograms. Talk with your health care provider about your test results, treatment options, and if necessary, the need for more tests. Vaccines  Your health care provider may recommend certain vaccines, such as: Influenza vaccine. This is recommended every year. Tetanus, diphtheria, and  acellular pertussis (Tdap, Td) vaccine. You may need a Td booster every 10 years. Zoster vaccine. You may need this after age 56. Pneumococcal 13-valent conjugate (PCV13) vaccine. One dose is recommended after age 61. Pneumococcal polysaccharide (PPSV23) vaccine. One dose is recommended after age 32. Talk to your health care provider about which screenings and vaccines you need and how often you need them. This information is not intended to replace advice given to you by your health care provider. Make sure you discuss any questions you have with your health care provider. Document Released: 01/06/2016 Document Revised: 08/29/2016 Document Reviewed: 10/11/2015 Elsevier Interactive Patient Education  2017 [employer].  Fall Prevention in the Home Falls can cause injuries. They can happen to people of all ages. There are many things you can do to make your home safe and to help prevent falls. What can I do on the outside of my home? Regularly fix the edges of walkways and driveways and fix any cracks. Remove anything that might make you trip as you walk through a door, such as a raised step or threshold. Trim any bushes or trees on the path to your home. Use bright outdoor lighting. Clear any walking paths of anything that might make someone trip, such as rocks or tools. Regularly check to see if handrails are loose or broken. Make sure that both sides of any steps have handrails. Any raised decks and porches should have guardrails on the edges. Have any leaves, snow, or ice cleared regularly. Use sand or salt on walking paths during winter. Clean up any spills in your garage right away. This includes oil or grease spills. What can I do in the bathroom? Use night lights. Install grab bars by the toilet and in the tub and shower. Do not use towel bars as grab bars. Use non-skid mats or decals in the tub or shower. If you need to sit down in the shower, use a plastic, non-slip stool. Keep  the floor dry. Clean up any water that spills on the floor as soon as it happens. Remove soap buildup in the tub or shower regularly. Attach bath mats securely with double-sided non-slip rug tape. Do not have throw rugs and other things on the floor that can make you trip. What can I do in the bedroom? Use night lights. Make sure that you have a light by your bed that is easy to reach. Do not use any sheets or blankets that are too big for your bed. They should not hang down onto the floor. Have a firm chair that has side arms. You can use this for support while you get dressed. Do not have throw rugs and other things on the floor that can make you trip. What can I do in the kitchen? Clean up any spills right away. Avoid walking on wet floors. Keep items that you use a lot in easy-to-reach places. If you need to reach something above you, use a strong step stool that has a grab bar. Keep electrical cords out of the way. Do not use floor polish or wax that makes floors slippery. If you must use wax, use non-skid floor wax. Do not have throw rugs and other things on the floor that  can make you trip. What can I do with my stairs? Do not leave any items on the stairs. Make sure that there are handrails on both sides of the stairs and use them. Fix handrails that are broken or loose. Make sure that handrails are as long as the stairways. Check any carpeting to make sure that it is firmly attached to the stairs. Fix any carpet that is loose or worn. Avoid having throw rugs at the top or bottom of the stairs. If you do have throw rugs, attach them to the floor with carpet tape. Make sure that you have a light switch at the top of the stairs and the bottom of the stairs. If you do not have them, ask someone to add them for you. What else can I do to help prevent falls? Wear shoes that: Do not have high heels. Have rubber bottoms. Are comfortable and fit you well. Are closed at the toe. Do not  wear sandals. If you use a stepladder: Make sure that it is fully opened. Do not climb a closed stepladder. Make sure that both sides of the stepladder are locked into place. Ask someone to hold it for you, if possible. Clearly mark and make sure that you can see: Any grab bars or handrails. First and last steps. Where the edge of each step is. Use tools that help you move around (mobility aids) if they are needed. These include: Canes. Walkers. Scooters. Crutches. Turn on the lights when you go into a dark area. Replace any light bulbs as soon as they burn out. Set up your furniture so you have a clear path. Avoid moving your furniture around. If any of your floors are uneven, fix them. If there are any pets around you, be aware of where they are. Review your medicines with your doctor. Some medicines can make you feel dizzy. This can increase your chance of falling. Ask your doctor what other things that you can do to help prevent falls. This information is not intended to replace advice given to you by your health care provider. Make sure you discuss any questions you have with your health care provider. Document Released: 10/06/2009 Document Revised: 05/17/2016 Document Reviewed: 01/14/2015 Elsevier Interactive Patient Education  2017 [employer].

## 2023-07-31 NOTE — Progress Notes (Signed)
Subjective:   Tanya Snyder is a 72 y.o. female who presents for Medicare Annual (Subsequent) preventive examination.  Visit Complete: In person  Patient Medicare AWV questionnaire was completed by the patient on  ; I have confirmed that all information answered by patient is correct and no changes since this date.  Review of Systems     Cardiac Risk Factors include: advanced age (>81men, >57 women)     Objective:    Today's Vitals   07/31/23 0850  BP: 120/62  Pulse: 65  Temp: 98.2 F (36.8 C)  TempSrc: Oral  SpO2: 99%  Weight: 146 lb 14.4 oz (66.6 kg)  Height: 5' 8.25" (1.734 m)   Body mass index is 22.17 kg/m.     07/31/2023    9:08 AM 01/24/2022    8:10 AM 03/21/2021    7:49 AM 09/01/2015    8:58 AM  Advanced Directives  Does Patient Have a Medical Advance Directive? Yes Yes Yes Yes  Type of Estate agent of [place];Living will Healthcare Power of [place];Living will Healthcare Power of [place];Living will   Does patient want to make changes to medical advance directive?  No - Patient declined    Copy of Healthcare Power of Attorney in Chart? No - copy requested No - copy requested No - copy requested     Current Medications (verified) Outpatient Encounter Medications as of 07/31/2023  Medication Sig   albuterol (VENTOLIN HFA) 108 (90 Base) MCG/ACT inhaler SMARTSIG:1-2 Puff(s) Via Inhaler Every 6 Hours PRN   aluminum hydroxide-magnesium carbonate (GAVISCON) 95-358 MG/15ML SUSP Take 15 mLs by mouth.   amLODipine (NORVASC) 2.5 MG tablet Take 2.5 mg by mouth daily.   amphetamine-dextroamphetamine (ADDERALL) 10 MG tablet Take 10 mg by mouth every morning.   ascorbic acid (VITAMIN C) 1000 MG tablet Take 1,000 mg by mouth daily.   calcium carbonate (TUMS - DOSED IN MG ELEMENTAL CALCIUM) 500 MG chewable tablet Chew 1 tablet by mouth daily.   clobetasol ointment (TEMOVATE) 0.05 % Apply topically daily.   clonazePAM (KLONOPIN) 0.5 MG tablet  Take 0.5 mg by mouth 2 (two) times daily as needed for anxiety.   cyclobenzaprine (FLEXERIL) 5 MG tablet Take 5 mg by mouth at bedtime as needed.   D-MANNOSE PO Take by mouth.   Evolocumab (REPATHA SURECLICK) 140 MG/ML SOAJ Inject 140 mg into the skin every 14 (fourteen) days.   ezetimibe (ZETIA) 10 MG tablet Take 10 mg by mouth daily.   gabapentin (NEURONTIN) 100 MG capsule Take 1-3 capsules (100-300 mg total) by mouth at bedtime.   hydrOXYzine (ATARAX) 25 MG tablet Take 25 mg by mouth daily.   levothyroxine (SYNTHROID) 125 MCG tablet Take 125 mcg by mouth daily.   Methenamine-Sodium Salicylate 162-162.5 MG TABS Take by mouth.   nitrofurantoin (MACRODANTIN) 50 MG capsule Take 50 mg by mouth daily at 6 PM.   triamcinolone (NASACORT) 55 MCG/ACT AERO nasal inhaler Place into the nose.   WIXELA INHUB 250-50 MCG/DOSE AEPB 1 puff 2 (two) times daily.   zolpidem (AMBIEN) 5 MG tablet Take 5 mg by mouth at bedtime as needed.   No facility-administered encounter medications on file as of 07/31/2023.    Allergies (verified) Lortab [hydrocodone-acetaminophen] and Statins   History: Past Medical History:  Diagnosis Date   Asthma    Breast cancer (HCC)    Cancer (HCC)    Depression    Dysrhythmia    History of chicken pox    Hyperlipemia  Hypothyroidism    Scoliosis    Past Surgical History:  Procedure Laterality Date   ABDOMINAL HYSTERECTOMY     CHOLECYSTECTOMY     COLONOSCOPY WITH PROPOFOL N/A 09/01/2015   Procedure: COLONOSCOPY WITH PROPOFOL;  Surgeon: Elnita Maxwell, MD;  Location: [facility] ENDOSCOPY;  Service: Endoscopy;  Laterality: N/A;   COLONOSCOPY WITH PROPOFOL N/A 03/21/2021   Procedure: COLONOSCOPY WITH PROPOFOL;  Surgeon: Midge Minium, MD;  Location: [facility] ENDOSCOPY;  Service: Endoscopy;  Laterality: N/A;   MASTECTOMY     MYRINGOTOMY WITH TUBE PLACEMENT Right 01/24/2022   Procedure: MYRINGOTOMY WITH TUBE PLACEMENT;  Surgeon: Bud Face, MD;  Location: [facility];   Service: ENT;  Laterality: Right;  pt. request later in morning 8 am   nare polypectomy Left    SPINAL FUSION     UPPER GI ENDOSCOPY     Family History  Problem Relation Age of Onset   Multiple sclerosis Mother    Prostate cancer Maternal Uncle    Colon cancer Maternal Uncle    Colon cancer Maternal Grandmother    Social History   Socioeconomic History   Marital status: Divorced    Spouse name: Not on file   Number of children: Not on file   Years of education: Not on file   Highest education level: Not on file  Occupational History   Not on file  Tobacco Use   Smoking status: Former    Types: Cigarettes   Smokeless tobacco: Never  Vaping Use   Vaping status: Never Used  Substance and Sexual Activity   Alcohol use: No    Alcohol/week: 0.0 standard drinks of alcohol   Drug use: Never   Sexual activity: Not Currently  Other Topics Concern   Not on file  Social History Narrative   Not on file   Social Determinants of Health   Financial Resource Strain: Low Risk  (07/31/2023)   Overall Financial Resource Strain (CARDIA)    Difficulty of Paying Living Expenses: Not hard at all  Food Insecurity: No Food Insecurity (07/31/2023)   Hunger Vital Sign    Worried About Running Out of Food in the Last Year: Never true    Ran Out of Food in the Last Year: Never true  Transportation Needs: No Transportation Needs (07/31/2023)   PRAPARE - Administrator, Civil Service (Medical): No    Lack of Transportation (Non-Medical): No  Physical Activity: Sufficiently Active (07/31/2023)   Exercise Vital Sign    Days of Exercise per Week: 3 days    Minutes of Exercise per Session: 60 min  Stress: Stress Concern Present (07/31/2023)   [employer] of Occupational Health - Occupational Stress Questionnaire    Feeling of Stress : To some extent  Social Connections: Moderately Integrated (07/31/2023)   Social Connection and Isolation Panel [NHANES]    Frequency of Communication with  Friends and Family: More than three times a week    Frequency of Social Gatherings with Friends and Family: More than three times a week    Attends Religious Services: More than 4 times per year    Active Member of [employer] or Organizations: Yes    Attends Engineer, structural: More than 4 times per year    Marital Status: Divorced    Tobacco Counseling Counseling given: Not Answered   Clinical Intake:  Pre-visit preparation completed: No  Pain : No/denies pain     BMI - recorded: 22.17 Nutritional Status: BMI of 19-24  Normal  Nutritional Risks: None Diabetes: No  How often do you need to have someone help you when you read instructions, pamphlets, or other written materials from your doctor or pharmacy?: 1 - Never  Interpreter Needed?: No  Information entered by :: Theresa Mulligan LPN   Activities of Daily Living    07/31/2023    9:07 AM  In your present state of health, do you have any difficulty performing the following activities:  Hearing? 1  Comment Wears hearing aids  Vision? 0  Difficulty concentrating or making decisions? 0  Walking or climbing stairs? 0  Dressing or bathing? 0  Doing errands, shopping? 0  Preparing Food and eating ? N  Using the Toilet? N  In the past six months, have you accidently leaked urine? N  Do you have problems with loss of bowel control? N  Managing your Medications? N  Managing your Finances? N  Housekeeping or managing your Housekeeping? N    Patient Care Team: Karie Georges, MD as PCP - General (Family Medicine)  Indicate any recent Medical Services you may have received from other than [facility] providers in the past year (date may be approximate).     Assessment:   This is a routine wellness examination for Subrena.  Hearing/Vision screen Hearing Screening - Comments:: Wears hearing aids Vision Screening - Comments:: Wears rx glasses - up to date with routine eye exams with  Dr Dione Booze  Dietary issues and  exercise activities discussed:     Goals Addressed               This Visit's Progress     Live a long life (pt-stated)         Depression Screen    07/31/2023    9:06 AM 07/31/2023    9:04 AM 07/12/2023    2:15 PM  PHQ 2/9 Scores  PHQ - 2 Score 1 0 3  PHQ- 9 Score 1 0 7    Fall Risk    07/31/2023    9:08 AM 07/12/2023    2:15 PM  Fall Risk   Falls in the past year? 0 0  Number falls in past yr: 0 0  Injury with Fall? 0 0  Risk for fall due to : No Fall Risks No Fall Risks  Follow up Falls prevention discussed Falls evaluation completed    MEDICARE RISK AT HOME:  Medicare Risk at Home - 07/31/23 0916     Any stairs in or around the home? Yes    If so, are there any without handrails? No    Home free of loose throw rugs in walkways, pet beds, electrical cords, etc? Yes    Adequate lighting in your home to reduce risk of falls? Yes    Life alert? No    Use of a cane, walker or w/c? No    Grab bars in the bathroom? Yes    Shower chair or bench in shower? No    Elevated toilet seat or a handicapped toilet? No             TIMED UP AND GO:  Was the test performed?  Yes  Length of time to ambulate 10 feet: 10 sec Gait steady and fast without use of assistive device    Cognitive Function:        07/31/2023    9:08 AM  6CIT Screen  What Year? 0 points  What month? 0 points  What time? 0 points  Count back  from 20 0 points  Months in reverse 0 points  Repeat phrase 0 points  Total Score 0 points    Immunizations Immunization History  Administered Date(s) Administered   Influenza Inj Mdck Quad Pf 10/01/2016   Influenza, High Dose Seasonal PF 10/01/2016, 10/16/2018, 10/02/2019   Influenza-Unspecified 11/04/2015, 08/21/2017, 09/19/2017   Moderna Sars-Covid-2 Vaccination 02/07/2020, 03/02/2020, 09/19/2020   Pneumococcal Conjugate-13 11/22/2017, 11/11/2019   Tdap 08/08/2009, 08/14/2017   Zoster Recombinant(Shingrix) 08/21/2017, 12/06/2017, 03/14/2018     TDAP status: Up to date  Flu Vaccine status: Due, Education has been provided regarding the importance of this vaccine. Advised may receive this vaccine at local pharmacy or Health Dept. Aware to provide a copy of the vaccination record if obtained from local pharmacy or Health Dept. Verbalized acceptance and understanding.  Pneumococcal vaccine status: Due, Education has been provided regarding the importance of this vaccine. Advised may receive this vaccine at local pharmacy or Health Dept. Aware to provide a copy of the vaccination record if obtained from local pharmacy or Health Dept. Verbalized acceptance and understanding.  Covid-19 vaccine status: Completed vaccines  Qualifies for Shingles Vaccine? Yes   Zostavax completed Yes   Shingrix Completed?: Yes  Screening Tests Health Maintenance  Topic Date Due   Pneumonia Vaccine 14+ Years old (2 of 2 - PPSV23 or PCV20) 11/10/2020   INFLUENZA VACCINE  07/25/2023   COVID-19 Vaccine (6 - 2023-24 season) 03/24/2024 (Originally 01/01/2023)   Hepatitis C Screening  03/28/2024 (Originally 09/15/1969)   Medicare Annual Wellness (AWV)  07/30/2024   DTaP/Tdap/Td (3 - Td or Tdap) 08/15/2027   Colonoscopy  03/21/2028   DEXA SCAN  Completed   Zoster Vaccines- Shingrix  Completed   HPV VACCINES  Aged Out   MAMMOGRAM  Discontinued    Health Maintenance  Health Maintenance Due  Topic Date Due   Pneumonia Vaccine 86+ Years old (2 of 2 - PPSV23 or PCV20) 11/10/2020   INFLUENZA VACCINE  07/25/2023    Colorectal cancer screening: Type of screening: Colonoscopy. Completed 03/21/21. Repeat every 7 years    Bone Density status: Completed 04/01/23. Results reflect: Bone density results: OSTEOPENIA. Repeat every   years.  Lung Cancer Screening: (Low Dose CT Chest recommended if Age 80-80 years, 20 pack-year currently smoking OR have quit w/in 15years.)  qualify.     Additional Screening:  Hepatitis C Screening: does qualify;  Deferred  Vision Screening: Recommended annual ophthalmology exams for early detection of glaucoma and other disorders of the eye. Is the patient up to date with their annual eye exam?  Yes  Who is the provider or what is the name of the office in which the patient attends annual eye exams? Dr Dione Booze If pt is not established with a provider, would they like to be referred to a provider to establish care? No .   Dental Screening: Recommended annual dental exams for proper oral hygiene    Community Resource Referral / Chronic Care Management:  CRR required this visit?  No   CCM required this visit?  No     Plan:     I have personally reviewed and noted the following in the patient's chart:   Medical and social history Use of alcohol, tobacco or illicit drugs  Current medications and supplements including opioid prescriptions. Patient is not currently taking opioid prescriptions. Functional ability and status Nutritional status Physical activity Advanced directives List of other physicians Hospitalizations, surgeries, and ER visits in previous 12 months Vitals Screenings to include cognitive, depression,  and falls Referrals and appointments  In addition, I have reviewed and discussed with patient certain preventive protocols, quality metrics, and best practice recommendations. A written personalized care plan for preventive services as well as general preventive health recommendations were provided to patient.     Tillie Rung, LPN   12/26/2438   After Visit Summary: Given  Nurse Notes: None

## 2023-08-04 ENCOUNTER — Encounter: Payer: Self-pay | Admitting: Family Medicine

## 2023-08-08 ENCOUNTER — Telehealth: Payer: Self-pay | Admitting: Family Medicine

## 2023-08-08 NOTE — Telephone Encounter (Signed)
Requesting referral [phone] be resent, not scheduled by patient in a timely manner

## 2023-08-13 NOTE — Telephone Encounter (Signed)
Resent

## 2023-08-22 ENCOUNTER — Encounter: Payer: Self-pay | Admitting: Podiatry

## 2023-08-22 ENCOUNTER — Ambulatory Visit: Payer: Medicare Other | Admitting: Podiatry

## 2023-08-22 DIAGNOSIS — J301 Allergic rhinitis due to pollen: Secondary | ICD-10-CM | POA: Insufficient documentation

## 2023-08-22 DIAGNOSIS — D2372 Other benign neoplasm of skin of left lower limb, including hip: Secondary | ICD-10-CM | POA: Diagnosis not present

## 2023-08-22 DIAGNOSIS — L299 Pruritus, unspecified: Secondary | ICD-10-CM | POA: Insufficient documentation

## 2023-08-22 DIAGNOSIS — D2371 Other benign neoplasm of skin of right lower limb, including hip: Secondary | ICD-10-CM | POA: Diagnosis not present

## 2023-08-22 DIAGNOSIS — J3081 Allergic rhinitis due to animal (cat) (dog) hair and dander: Secondary | ICD-10-CM | POA: Insufficient documentation

## 2023-08-22 NOTE — Progress Notes (Signed)
Doing resolving come back clinic 2 weeks  Subjective:  Patient ID: Tanya Snyder, female    DOB: 1951/06/15,  MRN: [id number] HPI Chief Complaint  Patient presents with   Callouses    Trim calluses plantar forefoot bilateral    Nail Problem    Hallux right - white discoloration, happens every summer after taking nail polish off   New Patient (Initial Visit)    Est 2019    72 y.o. female presents with the above complaint.   ROS: Denies fever chills nausea vomit muscle aches pains calf pain back pain chest pain shortness of breath.  Past Medical History:  Diagnosis Date   Asthma    Breast cancer (HCC)    Cancer (HCC)    Depression    Dysrhythmia    History of chicken pox    Hyperlipemia    Hypothyroidism    Scoliosis    Past Surgical History:  Procedure Laterality Date   ABDOMINAL HYSTERECTOMY     CHOLECYSTECTOMY     COLONOSCOPY WITH PROPOFOL N/A 09/01/2015   Procedure: COLONOSCOPY WITH PROPOFOL;  Surgeon: Elnita Maxwell, MD;  Location: [facility] ENDOSCOPY;  Service: Endoscopy;  Laterality: N/A;   COLONOSCOPY WITH PROPOFOL N/A 03/21/2021   Procedure: COLONOSCOPY WITH PROPOFOL;  Surgeon: Midge Minium, MD;  Location[facility] ENDOSCOPY;  Service: Endoscopy;  Laterality: N/A;   MASTECTOMY     MYRINGOTOMY WITH TUBE PLACEMENT Right 01/24/2022   Procedure: MYRINGOTOMY WITH TUBE PLACEMENT;  Surgeon: Bud Face, MD;  Location: [facility];  Service: ENT;  Laterality: Right;  pt. request later in morning 8 am   nare polypectomy Left    SPINAL FUSION     UPPER GI ENDOSCOPY      Current Outpatient Medications:    albuterol (VENTOLIN HFA) 108 (90 Base) MCG/ACT inhaler, SMARTSIG:1-2 Puff(s) Via Inhaler Every 6 Hours PRN, Disp: , Rfl:    amLODipine (NORVASC) 2.5 MG tablet, Take 2.5 mg by mouth daily., Disp: , Rfl:    amphetamine-dextroamphetamine (ADDERALL) 10 MG tablet, Take 10 mg by mouth every morning., Disp: , Rfl:    ascorbic acid (VITAMIN C) 1000 MG tablet, Take  1,000 mg by mouth daily., Disp: , Rfl:    calcium carbonate (TUMS - DOSED IN MG ELEMENTAL CALCIUM) 500 MG chewable tablet, Chew 1 tablet by mouth daily., Disp: , Rfl:    clobetasol ointment (TEMOVATE) 0.05 %, Apply topically daily., Disp: , Rfl:    clonazePAM (KLONOPIN) 0.5 MG tablet, Take 0.5 mg by mouth 2 (two) times daily as needed for anxiety., Disp: , Rfl:    cyclobenzaprine (FLEXERIL) 5 MG tablet, Take 5 mg by mouth at bedtime as needed., Disp: , Rfl:    D-MANNOSE PO, Take by mouth., Disp: , Rfl:    Evolocumab (REPATHA SURECLICK) 140 MG/ML SOAJ, Inject 140 mg into the skin every 14 (fourteen) days., Disp: 6 mL, Rfl: 1   ezetimibe (ZETIA) 10 MG tablet, Take 10 mg by mouth daily., Disp: , Rfl:    gabapentin (NEURONTIN) 100 MG capsule, Take 1-3 capsules (100-300 mg total) by mouth at bedtime., Disp: 90 capsule, Rfl: 1   hydrOXYzine (ATARAX) 25 MG tablet, Take 25 mg by mouth daily., Disp: , Rfl:    levothyroxine (SYNTHROID) 125 MCG tablet, Take 125 mcg by mouth daily., Disp: , Rfl:    Methenamine-Sodium Salicylate 162-162.5 MG TABS, Take by mouth., Disp: , Rfl:    nitrofurantoin (MACRODANTIN) 50 MG capsule, Take 50 mg by mouth daily at 6 PM., Disp: ,  Rfl:    triamcinolone (NASACORT) 55 MCG/ACT AERO nasal inhaler, Place into the nose., Disp: , Rfl:    WIXELA INHUB 250-50 MCG/DOSE AEPB, 1 puff 2 (two) times daily., Disp: , Rfl:    zolpidem (AMBIEN) 5 MG tablet, Take 5 mg by mouth at bedtime as needed., Disp: , Rfl:   Allergies  Allergen Reactions   Lortab [Hydrocodone-Acetaminophen] Other (See Comments)    Nausea, headache   Statins     CK >3600    Review of Systems Objective:  There were no vitals filed for this visit.  General: Well developed, nourished, in no acute distress, alert and oriented x3   Dermatological: Skin is warm, dry and supple bilateral. Nails x 10 are well maintained; remaining integument appears unremarkable at this time. There are no open sores, no preulcerative  lesions, no rash or signs of infection present.  She has a white powdery discoloration to the top of her right great toenail.  She states that happens every year and it grows out.  She is wondering if it might be something infectious.  She is also complaining of painful benign lesions plantar aspect of the forefoot.  Vascular: Dorsalis Pedis artery and Posterior Tibial artery pedal pulses are 2/4 bilateral with immedate capillary fill time. Pedal hair growth present. No varicosities and no lower extremity edema present bilateral.   Neruologic: Grossly intact via light touch bilateral. Vibratory intact via tuning fork bilateral. Protective threshold with Semmes Wienstein monofilament intact to all pedal sites bilateral. Patellar and Achilles deep tendon reflexes 2+ bilateral. No Babinski or clonus noted bilateral.   Musculoskeletal: No gross boney pedal deformities bilateral. No pain, crepitus, or limitation noted with foot and ankle range of motion bilateral. Muscular strength 5/5 in all groups tested bilateral.  Gait: Unassisted, Nonantalgic.    Radiographs:  None taken  Assessment & Plan:   Assessment: Benign skin lesions forefoot bilateral.  No open lesions or wounds are noted.  The hallux nail demonstrates white discoloration which appears to be nail paint binder.  Plan: Discussed etiology pathology conservative surgical therapies at this point I debrided the white from the nail.  I also debrided all benign skin lesions follow-up with her as needed     Marquice Uddin T. [place], [place]

## 2023-08-27 ENCOUNTER — Other Ambulatory Visit: Payer: Self-pay | Admitting: Family Medicine

## 2023-08-27 DIAGNOSIS — M545 Low back pain, unspecified: Secondary | ICD-10-CM

## 2023-08-28 ENCOUNTER — Other Ambulatory Visit (HOSPITAL_COMMUNITY): Payer: Self-pay

## 2023-08-28 MED ORDER — GABAPENTIN 100 MG PO CAPS
100.0000 mg | ORAL_CAPSULE | Freq: Every day | ORAL | 0 refills | Status: DC
Start: 2023-08-28 — End: 2023-09-23
  Filled 2023-08-28: qty 90, 30d supply, fill #0

## 2023-08-29 ENCOUNTER — Encounter: Payer: Self-pay | Admitting: Cardiovascular Disease

## 2023-09-09 ENCOUNTER — Ambulatory Visit: Payer: Medicare Other | Admitting: Obstetrics and Gynecology

## 2023-09-21 ENCOUNTER — Encounter: Payer: Self-pay | Admitting: Family Medicine

## 2023-09-21 ENCOUNTER — Encounter: Payer: Self-pay | Admitting: Cardiovascular Disease

## 2023-09-21 DIAGNOSIS — L299 Pruritus, unspecified: Secondary | ICD-10-CM

## 2023-09-23 ENCOUNTER — Other Ambulatory Visit: Payer: Self-pay | Admitting: Family Medicine

## 2023-09-23 ENCOUNTER — Telehealth: Payer: Self-pay | Admitting: Cardiovascular Disease

## 2023-09-23 DIAGNOSIS — M545 Low back pain, unspecified: Secondary | ICD-10-CM

## 2023-09-23 NOTE — Telephone Encounter (Signed)
Pt called to F/U, stating she is having a very difficult time with the itching.  Please call Pt back, ASAP, to discuss!!

## 2023-09-23 NOTE — Telephone Encounter (Signed)
Patient states she has been having extreme itching and feels its from the repatha. She states she also has had a UTI which she think is from repatha. She states she has calamine lotion 1%, and zinc it only last 5 minutes. Denies having a rash. Will forward to provider. Next dose of repatha is scheduled for tomorow

## 2023-09-23 NOTE — Telephone Encounter (Signed)
Pt c/o medication issue:  1. Name of Medication: Evolocumab (REPATHA SURECLICK) 140 MG/ML SOAJ   2. How are you currently taking this medication (dosage and times per day)? Inject 140 mg into the skin every 14 (fourteen) days.   3. Are you having a reaction (difficulty breathing--STAT)? no  4. What is your medication issue? Patient is having extreme itchiness, has been going on for weeks and is getting worse, on chest, throat, and arms.  Last gave herself injection on 9/16.

## 2023-09-24 NOTE — Telephone Encounter (Signed)
Please have patient take 25 -50 mg of benadryl over the counter and schedule an appointment to be evaluated.

## 2023-09-25 NOTE — Telephone Encounter (Signed)
Left detailed message for pt with pharmD recommendations. Call back number left if pt has questions or concerns.

## 2023-09-30 MED ORDER — TRIAMCINOLONE ACETONIDE 0.1 % EX CREA
1.0000 | TOPICAL_CREAM | Freq: Two times a day (BID) | CUTANEOUS | 0 refills | Status: DC | PRN
Start: 2023-09-30 — End: 2024-07-28

## 2023-10-08 ENCOUNTER — Ambulatory Visit: Payer: Medicare Other | Admitting: Cardiovascular Disease

## 2023-10-09 ENCOUNTER — Encounter (INDEPENDENT_AMBULATORY_CARE_PROVIDER_SITE_OTHER): Payer: Self-pay | Admitting: Ophthalmology

## 2023-10-09 ENCOUNTER — Ambulatory Visit (INDEPENDENT_AMBULATORY_CARE_PROVIDER_SITE_OTHER): Payer: Medicare Other | Admitting: Ophthalmology

## 2023-10-09 DIAGNOSIS — H04123 Dry eye syndrome of bilateral lacrimal glands: Secondary | ICD-10-CM

## 2023-10-09 DIAGNOSIS — H33321 Round hole, right eye: Secondary | ICD-10-CM

## 2023-10-09 DIAGNOSIS — H25013 Cortical age-related cataract, bilateral: Secondary | ICD-10-CM | POA: Diagnosis not present

## 2023-10-09 DIAGNOSIS — I10 Essential (primary) hypertension: Secondary | ICD-10-CM

## 2023-10-09 DIAGNOSIS — H35033 Hypertensive retinopathy, bilateral: Secondary | ICD-10-CM | POA: Diagnosis not present

## 2023-10-09 NOTE — Progress Notes (Signed)
[facility] - Clinic Note  10/09/2023   CHIEF COMPLAINT Patient presents for Retina Evaluation  HISTORY OF PRESENT ILLNESS: Tanya Snyder is a 72 y.o. female who presents to the clinic today for:  HPI     Retina Evaluation   In right eye.  This started 1 day ago.  Duration of 1 day.  Associated Symptoms Floaters.        Comments   Retia eval per DR Dione Booze for tear OD pt  was seen today for her annual exam she has been having floaters denies any flashers or decrease in vision       Last edited by Etheleen Mayhew, COT on 10/09/2023  3:03 PM.     Patient just moved here from [facility] and was establishing eye care with [facility]. She was going to [facility] seeing Dr. Brooke Dare.    Referring physician: Sallye Lat, MD [address] [place],  [place] [address]  HISTORICAL INFORMATION:  Selected notes from the MEDICAL RECORD NUMBER Referred by Dr. Fabian Sharp for concern of retinal tear LEE:  Ocular Hx- PMH-   CURRENT MEDICATIONS: No current outpatient medications on file. (Ophthalmic Drugs)   No current facility-administered medications for this visit. (Ophthalmic Drugs)   Current Outpatient Medications (Other)  Medication Sig   albuterol (VENTOLIN HFA) 108 (90 Base) MCG/ACT inhaler SMARTSIG:1-2 Puff(s) Via Inhaler Every 6 Hours PRN   amLODipine (NORVASC) 2.5 MG tablet Take 2.5 mg by mouth daily.   amphetamine-dextroamphetamine (ADDERALL) 10 MG tablet Take 10 mg by mouth every morning.   ascorbic acid (VITAMIN C) 1000 MG tablet Take 1,000 mg by mouth daily.   calcium carbonate (TUMS - DOSED IN MG ELEMENTAL CALCIUM) 500 MG chewable tablet Chew 1 tablet by mouth daily.   clobetasol ointment (TEMOVATE) 0.05 % Apply topically daily.   clonazePAM (KLONOPIN) 0.5 MG tablet Take 0.5 mg by mouth 2 (two) times daily as needed for anxiety.   cyclobenzaprine (FLEXERIL) 5 MG tablet Take 5 mg by mouth at bedtime as needed.    D-MANNOSE PO Take by mouth.   Evolocumab (REPATHA SURECLICK) 140 MG/ML SOAJ Inject 140 mg into the skin every 14 (fourteen) days.   ezetimibe (ZETIA) 10 MG tablet Take 10 mg by mouth daily.   gabapentin (NEURONTIN) 100 MG capsule Take 1-3 capsules (100-300 mg total) by mouth at bedtime.   hydrOXYzine (ATARAX) 25 MG tablet Take 25 mg by mouth daily.   levothyroxine (SYNTHROID) 125 MCG tablet Take 125 mcg by mouth daily.   Methenamine-Sodium Salicylate 162-162.5 MG TABS Take by mouth.   nitrofurantoin (MACRODANTIN) 50 MG capsule Take 50 mg by mouth daily at 6 PM.   triamcinolone (NASACORT) 55 MCG/ACT AERO nasal inhaler Place into the nose.   triamcinolone cream (KENALOG) 0.1 % Apply 1 Application topically 2 (two) times daily as needed.   WIXELA INHUB 250-50 MCG/DOSE AEPB 1 puff 2 (two) times daily.   zolpidem (AMBIEN) 5 MG tablet Take 5 mg by mouth at bedtime as needed.   No current facility-administered medications for this visit. (Other)   REVIEW OF SYSTEMS: ROS   Positive for: Neurological, Cardiovascular, Eyes Last edited by Etheleen Mayhew, COT on 10/09/2023  3:03 PM.     ALLERGIES Allergies  Allergen Reactions   Lortab [Hydrocodone-Acetaminophen] Other (See Comments)    Nausea, headache   Statins     CK >3600    PAST MEDICAL HISTORY Past Medical History:  Diagnosis  Date   Asthma    Breast cancer (HCC)    Cancer (HCC)    Depression    Dysrhythmia    History of chicken pox    Hyperlipemia    Hypothyroidism    Scoliosis    Past Surgical History:  Procedure Laterality Date   ABDOMINAL HYSTERECTOMY     CHOLECYSTECTOMY     COLONOSCOPY WITH PROPOFOL N/A 09/01/2015   Procedure: COLONOSCOPY WITH PROPOFOL;  Surgeon: Elnita Maxwell, MD;  Location: [facility] ENDOSCOPY;  Service: Endoscopy;  Laterality: N/A;   COLONOSCOPY WITH PROPOFOL N/A 03/21/2021   Procedure: COLONOSCOPY WITH PROPOFOL;  Surgeon: Midge Minium, MD;  Location: [facility] ENDOSCOPY;  Service: Endoscopy;   Laterality: N/A;   MASTECTOMY     MYRINGOTOMY WITH TUBE PLACEMENT Right 01/24/2022   Procedure: MYRINGOTOMY WITH TUBE PLACEMENT;  Surgeon: Bud Face, MD;  Location: [facility];  Service: ENT;  Laterality: Right;  pt. request later in morning 8 am   nare polypectomy Left    SPINAL FUSION     UPPER GI ENDOSCOPY     FAMILY HISTORY Family History  Problem Relation Age of Onset   Multiple sclerosis Mother    Prostate cancer Maternal Uncle    Colon cancer Maternal Uncle    Colon cancer Maternal Grandmother    SOCIAL HISTORY Social History   Tobacco Use   Smoking status: Former    Types: Cigarettes   Smokeless tobacco: Never  Vaping Use   Vaping status: Never Used  Substance Use Topics   Alcohol use: No    Alcohol/week: 0.0 standard drinks of alcohol   Drug use: Never       OPHTHALMIC EXAM:  Base Eye Exam     Visual Acuity (Snellen - Linear)       Right Left   Dist cc 20/30 -2 20/40 -1   Dist ph cc 20/25 -2 20/30 -1    Correction: Glasses         Tonometry (Tonopen, 3:08 PM)       Right Left   Pressure 15 14         Pupils       Pupils Dark   Right PERRL dilated   Left PERRL dilated         Visual Fields       Left Right    Full Full         Extraocular Movement       Right Left    Full, Ortho Full, Ortho         Dilation     Both eyes: 2.5% Phenylephrine @ 3:05 PM           Slit Lamp and Fundus Exam     External Exam       Right Left   External Normal Normal         Slit Lamp Exam       Right Left   Lids/Lashes Dermatochalasis - upper lid, Meibomian gland dysfunction Dermatochalasis - upper lid, Meibomian gland dysfunction   Conjunctiva/Sclera Nasal and temopral Pinguecula Nasal and temopral Pinguecula   Cornea 2-3+ Punctate epithelial erosions 1+ Punctate epithelial erosions   Anterior Chamber Deep and clear Deep and clear   Iris Round and dilated Round and dilated   Lens 2-3+ Nuclear sclerosis, 2-3+  Cortical cataract 2-3+ Nuclear sclerosis, 2-3+ Cortical cataract   Anterior Vitreous Vitreous syneresis, Posterior vitreous detachment Vitreous syneresis         Fundus Exam  Right Left   Disc Pink and sharp, Compact, mild tilt Pink and sharp, Compact, Tilted disc, temporal PPA   C/D Ratio 0.2 0.3   Macula Flat, Blunted foveal reflex, RPE mottling and clumping, No heme or edema Flat, Blunted foveal reflex, RPE mottling and clumping, No heme or edema   Vessels Tortuous, Vascular attenuation Tortuous, Vascular attenuation   Periphery Large operculated tear at 1000 with patchy pigment deposition, no SRF Attached, No heme           IMAGING AND PROCEDURES  Imaging and Procedures for 10/09/2023  OCT, Retina - OU - Both Eyes       Right Eye Quality was good. Central Foveal Thickness: 259. Progression has no prior data. Findings include normal foveal contour, no IRF, no SRF, myopic contour (Large retinal hole with surrounding cystic changes ST periphery caught on widefield).   Left Eye Quality was good. Central Foveal Thickness: 252. Progression has no prior data. Findings include normal foveal contour, no IRF, no SRF, myopic contour.   Notes *Images captured and stored on drive  Diagnosis / Impression:  OD: Large retinal hole with surrounding cystic changes ST periphery caught on widefield OS:  Clinical management:  See below  Abbreviations: NFP - Normal foveal profile. CME - cystoid macular edema. PED - pigment epithelial detachment. IRF - intraretinal fluid. SRF - subretinal fluid. EZ - ellipsoid zone. ERM - epiretinal membrane. ORA - outer retinal atrophy. ORT - outer retinal tubulation. SRHM - subretinal hyper-reflective material. IRHM - intraretinal hyper-reflective material      Repair Retinal Breaks, Laser - OD - Right Eye       Tear locations include temporal.   Time Out Confirmed correct patient, procedure, site, and patient consented.    Anesthesia Anesthetic medications included Lidocaine 2%.           ASSESSMENT/PLAN:   ICD-10-CM   1. Retinal hole of right eye  H33.321 OCT, Retina - OU - Both Eyes    Repair Retinal Breaks, Laser - OD - Right Eye    2. Essential hypertension  I10     3. Hypertensive retinopathy of both eyes  H35.033     4. Cortical age-related cataract of both eyes  H25.013     5. Bilateral dry eyes  H04.123      Retinal Hole, OD. - The incidence, risk factors, and natural history of retinal tear was discussed with patient.   - Potential treatment options including laser retinopexy and cryotherapy discussed with patient. - tear located - Large operculated tear at 1000 with patchy pigment deposition - BCVA 20/25 - OCT shows Large retinal hole with surrounding cystic changes ST periphery caught on widefield - recommend laser retinopexy (unable to perform today due to lack of ride), patient will reschedule when she has a ride - RBA of procedure discussed, questions answered - f/u Tue/Wed of next week, DFE, OCT, possible laser retinopexy OD  2,3. Hypertensive retinopathy OU - discussed importance of tight BP control - monitor   4. Mixed Cataract OU - The symptoms of cataract, surgical options, and treatments and risks were discussed with patient. - discussed diagnosis and progression - under expert care of [facility] - monitor   5. Dry eyes OU - recommend artificial tears and lubricating ointment as needed   Ophthalmic Meds Ordered this visit:  No orders of the defined types were placed in this encounter.    No follow-ups on file.  There are no Patient Instructions on file  for this visit.  Explained the diagnoses, plan, and follow up with the patient and they expressed understanding.  Patient expressed understanding of the importance of proper follow up care.   This document serves as a record of services personally performed by Karie Chimera, MD, PhD. It was created on  their behalf by Glee Arvin. Manson Passey, OA an ophthalmic technician. The creation of this record is the provider's dictation and/or activities during the visit.    Electronically signed by: Glee Arvin. Manson Passey, OA 10/09/23 4:28 PM   Karie Chimera, M.D., Ph.D. Diseases & Surgery of the Retina and Vitreous [facility] 10/09/2023  Abbreviations: M myopia (nearsighted); A astigmatism; H hyperopia (farsighted); P presbyopia; Mrx spectacle prescription;  CTL contact lenses; OD right eye; OS left eye; OU both eyes  XT exotropia; ET esotropia; PEK punctate epithelial keratitis; PEE punctate epithelial erosions; DES dry eye syndrome; MGD meibomian gland dysfunction; ATs artificial tears; PFAT's preservative free artificial tears; NSC nuclear sclerotic cataract; PSC posterior subcapsular cataract; ERM epi-retinal membrane; PVD posterior vitreous detachment; RD retinal detachment; DM diabetes mellitus; DR diabetic retinopathy; NPDR non-proliferative diabetic retinopathy; PDR proliferative diabetic retinopathy; CSME clinically significant macular edema; DME diabetic macular edema; dbh dot blot hemorrhages; CWS cotton wool spot; POAG primary open angle glaucoma; C/D cup-to-disc ratio; HVF humphrey visual field; GVF goldmann visual field; OCT optical coherence tomography; IOP intraocular pressure; BRVO Branch retinal vein occlusion; CRVO central retinal vein occlusion; CRAO central retinal artery occlusion; BRAO branch retinal artery occlusion; RT retinal tear; SB scleral buckle; PPV pars plana vitrectomy; VH Vitreous hemorrhage; PRP panretinal laser photocoagulation; IVK intravitreal kenalog; VMT vitreomacular traction; MH Macular hole;  NVD neovascularization of the disc; NVE neovascularization elsewhere; AREDS age related eye disease study; ARMD age related macular degeneration; POAG primary open angle glaucoma; EBMD epithelial/anterior basement membrane dystrophy; ACIOL anterior chamber intraocular  lens; IOL intraocular lens; PCIOL posterior chamber intraocular lens; Phaco/IOL phacoemulsification with intraocular lens placement; PRK photorefractive keratectomy; LASIK laser assisted in situ keratomileusis; HTN hypertension; DM diabetes mellitus; COPD chronic obstructive pulmonary disease

## 2023-10-14 ENCOUNTER — Encounter: Payer: Self-pay | Admitting: Internal Medicine

## 2023-10-14 ENCOUNTER — Ambulatory Visit (INDEPENDENT_AMBULATORY_CARE_PROVIDER_SITE_OTHER): Payer: Medicare Other | Admitting: Internal Medicine

## 2023-10-14 VITALS — BP 138/82 | HR 65 | Temp 98.4°F | Wt 145.1 lb

## 2023-10-14 DIAGNOSIS — U071 COVID-19: Secondary | ICD-10-CM

## 2023-10-14 DIAGNOSIS — R059 Cough, unspecified: Secondary | ICD-10-CM

## 2023-10-14 LAB — POC COVID19 BINAXNOW: SARS Coronavirus 2 Ag: POSITIVE — AB

## 2023-10-14 LAB — POCT INFLUENZA A/B
Influenza A, POC: NEGATIVE
Influenza B, POC: NEGATIVE

## 2023-10-14 MED ORDER — AMOXICILLIN-POT CLAVULANATE 875-125 MG PO TABS
1.0000 | ORAL_TABLET | Freq: Two times a day (BID) | ORAL | 0 refills | Status: AC
Start: 2023-10-14 — End: 2023-10-21

## 2023-10-14 NOTE — Progress Notes (Signed)
Established Patient Office Visit     CC/Reason for Visit: Post COVID congestion  HPI: Tanya Snyder is a 72 y.o. female who is coming in today for the above mentioned reasons.  2 weeks ago she tested positive for COVID.  Her main symptoms have been extreme fatigue, right ear fullness and extreme body aches.  She has a cough productive of yellow sputum, no fever.  She has a history of severe scoliosis and is very prone to lung infections.   Past Medical/Surgical History: Past Medical History:  Diagnosis Date   Asthma    Breast cancer (HCC)    Cancer (HCC)    Depression    Dysrhythmia    History of chicken pox    Hyperlipemia    Hypothyroidism    Scoliosis     Past Surgical History:  Procedure Laterality Date   ABDOMINAL HYSTERECTOMY     CHOLECYSTECTOMY     COLONOSCOPY WITH PROPOFOL N/A 09/01/2015   Procedure: COLONOSCOPY WITH PROPOFOL;  Surgeon: Elnita Maxwell, MD;  Location: [facility] ENDOSCOPY;  Service: Endoscopy;  Laterality: N/A;   COLONOSCOPY WITH PROPOFOL N/A 03/21/2021   Procedure: COLONOSCOPY WITH PROPOFOL;  Surgeon: Midge Minium, MD;  Location: [facility] ENDOSCOPY;  Service: Endoscopy;  Laterality: N/A;   MASTECTOMY     MYRINGOTOMY WITH TUBE PLACEMENT Right 01/24/2022   Procedure: MYRINGOTOMY WITH TUBE PLACEMENT;  Surgeon: Bud Face, MD;  Location: [facility];  Service: ENT;  Laterality: Right;  pt. request later in morning 8 am   nare polypectomy Left    SPINAL FUSION     UPPER GI ENDOSCOPY      Social History:  reports that she has quit smoking. Her smoking use included cigarettes. She has never used smokeless tobacco. She reports that she does not drink alcohol and does not use drugs.  Allergies: Allergies  Allergen Reactions   Lortab [Hydrocodone-Acetaminophen] Other (See Comments)    Nausea, headache   Statins     CK >3600     Family History:  Family History  Problem Relation Age of Onset   Multiple sclerosis Mother     Prostate cancer Maternal Uncle    Colon cancer Maternal Uncle    Colon cancer Maternal Grandmother      Current Outpatient Medications:    albuterol (VENTOLIN HFA) 108 (90 Base) MCG/ACT inhaler, SMARTSIG:1-2 Puff(s) Via Inhaler Every 6 Hours PRN, Disp: , Rfl:    amLODipine (NORVASC) 2.5 MG tablet, Take 2.5 mg by mouth daily., Disp: , Rfl:    amoxicillin-clavulanate (AUGMENTIN) 875-125 MG tablet, Take 1 tablet by mouth 2 (two) times daily for 7 days., Disp: 14 tablet, Rfl: 0   amphetamine-dextroamphetamine (ADDERALL) 10 MG tablet, Take 10 mg by mouth every morning., Disp: , Rfl:    ascorbic acid (VITAMIN C) 1000 MG tablet, Take 1,000 mg by mouth daily., Disp: , Rfl:    calcium carbonate (TUMS - DOSED IN MG ELEMENTAL CALCIUM) 500 MG chewable tablet, Chew 1 tablet by mouth daily., Disp: , Rfl:    clobetasol ointment (TEMOVATE) 0.05 %, Apply topically daily., Disp: , Rfl:    clonazePAM (KLONOPIN) 0.5 MG tablet, Take 0.5 mg by mouth 2 (two) times daily as needed for anxiety., Disp: , Rfl:    cyclobenzaprine (FLEXERIL) 5 MG tablet, Take 5 mg by mouth at bedtime as needed., Disp: , Rfl:    D-MANNOSE PO, Take by mouth., Disp: , Rfl:    Evolocumab (REPATHA SURECLICK) 140 MG/ML SOAJ, Inject 140 mg  into the skin every 14 (fourteen) days., Disp: 6 mL, Rfl: 1   ezetimibe (ZETIA) 10 MG tablet, Take 10 mg by mouth daily., Disp: , Rfl:    gabapentin (NEURONTIN) 100 MG capsule, Take 1-3 capsules (100-300 mg total) by mouth at bedtime., Disp: 90 capsule, Rfl: 0   hydrOXYzine (ATARAX) 25 MG tablet, Take 25 mg by mouth daily., Disp: , Rfl:    levothyroxine (SYNTHROID) 125 MCG tablet, Take 125 mcg by mouth daily., Disp: , Rfl:    Methenamine-Sodium Salicylate 162-162.5 MG TABS, Take by mouth., Disp: , Rfl:    nitrofurantoin (MACRODANTIN) 50 MG capsule, Take 50 mg by mouth daily at 6 PM., Disp: , Rfl:    triamcinolone (NASACORT) 55 MCG/ACT AERO nasal inhaler, Place into the nose., Disp: , Rfl:    triamcinolone  cream (KENALOG) 0.1 %, Apply 1 Application topically 2 (two) times daily as needed., Disp: 45 g, Rfl: 0   WIXELA INHUB 250-50 MCG/DOSE AEPB, 1 puff 2 (two) times daily., Disp: , Rfl:    zolpidem (AMBIEN) 5 MG tablet, Take 5 mg by mouth at bedtime as needed., Disp: , Rfl:   Review of Systems:  Negative unless indicated in HPI.   Physical Exam: Vitals:   10/14/23 1106 10/14/23 1110  BP: (!) 140/80 138/82  Pulse: 65   Temp: 98.4 F (36.9 C)   TempSrc: Oral   SpO2: 96%   Weight: 145 lb 1.6 oz (65.8 kg)     Body mass index is 21.9 kg/m.   Physical Exam Pulmonary:     Effort: Pulmonary effort is normal.     Breath sounds: Normal breath sounds. No wheezing, rhonchi or rales.      Impression and Plan:  Cough, unspecified type -     POC COVID-19 BinaxNow -     POCT Influenza A/B -     Amoxicillin-Pot Clavulanate; Take 1 tablet by mouth 2 (two) times daily for 7 days.  Dispense: 14 tablet; Refill: 0  COVID-19  -Outside of the window to be treated with COVID directed therapy.  I do not believe she has a secondary bacterial infection at this time so no antibiotics are needed.  She would feel more comfortable having a prescription of antibiotic at the pharmacy in case her symptoms were to worsen given her history.  Augmentin will be sent.  She has been instructed not to fill this prescription unless she develops low-grade temperatures, dyspnea or cough productive of green sputum in which case we would like to hear back from her as well.   Time spent:22 minutes reviewing chart, interviewing and examining patient and formulating plan of care.     Chaya Jan, MD [facility]

## 2023-10-15 ENCOUNTER — Encounter (INDEPENDENT_AMBULATORY_CARE_PROVIDER_SITE_OTHER): Payer: Medicare Other | Admitting: Ophthalmology

## 2023-10-22 ENCOUNTER — Other Ambulatory Visit (HOSPITAL_COMMUNITY): Payer: Self-pay

## 2023-10-22 ENCOUNTER — Encounter (INDEPENDENT_AMBULATORY_CARE_PROVIDER_SITE_OTHER): Payer: Medicare Other | Admitting: Ophthalmology

## 2023-10-22 ENCOUNTER — Encounter (INDEPENDENT_AMBULATORY_CARE_PROVIDER_SITE_OTHER): Payer: Self-pay

## 2023-10-22 DIAGNOSIS — H04123 Dry eye syndrome of bilateral lacrimal glands: Secondary | ICD-10-CM

## 2023-10-22 DIAGNOSIS — H25013 Cortical age-related cataract, bilateral: Secondary | ICD-10-CM

## 2023-10-22 DIAGNOSIS — H35033 Hypertensive retinopathy, bilateral: Secondary | ICD-10-CM

## 2023-10-22 DIAGNOSIS — H33321 Round hole, right eye: Secondary | ICD-10-CM

## 2023-10-22 DIAGNOSIS — I10 Essential (primary) hypertension: Secondary | ICD-10-CM

## 2023-10-22 MED ORDER — GABAPENTIN 100 MG PO CAPS
100.0000 mg | ORAL_CAPSULE | Freq: Every day | ORAL | 0 refills | Status: DC
Start: 2023-10-22 — End: 2023-12-16
  Filled 2023-10-22: qty 90, 30d supply, fill #0

## 2023-10-23 ENCOUNTER — Other Ambulatory Visit (HOSPITAL_COMMUNITY): Payer: Self-pay

## 2023-11-12 ENCOUNTER — Ambulatory Visit: Payer: Medicare Other | Attending: Cardiovascular Disease | Admitting: Cardiovascular Disease

## 2023-11-12 ENCOUNTER — Encounter: Payer: Self-pay | Admitting: Cardiovascular Disease

## 2023-11-12 VITALS — BP 137/78 | HR 72 | Ht 68.0 in | Wt 148.0 lb

## 2023-11-12 DIAGNOSIS — I341 Nonrheumatic mitral (valve) prolapse: Secondary | ICD-10-CM

## 2023-11-12 DIAGNOSIS — E782 Mixed hyperlipidemia: Secondary | ICD-10-CM

## 2023-11-12 NOTE — Progress Notes (Signed)
11/12/2023 Tanya Snyder   1951/01/30  [id number]  Primary Physician Karie Georges, MD Primary Cardiologist: Runell Gess MD Nicholes Calamity, [place]  HPI:  Tanya Snyder is a 72 y.o.  thin-appearing divorced Caucasian female mother of 2 children, grandmother of 3 grandchildren who was referred by her PCP, Dr. Nira Conn, for cardiovascular valuation because of mitral valve prolapse and atypical chest pain.  I last saw her in the office 04/30/2023.  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 She recently sent for was from [facility], moved to [place] and ultimately to [place] to be closer to family. Risk factors include treated hypertension and hyperlipidemia. There is no family history of heart disease. She is never had a heart attack or stroke. She does get some shortness of breath and atypical chest pain. 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 She is a breast cancer survivor and has had a mastectomy in the past.   Unfortunately, since I saw her 6 months ago her son has come down with brain cancer at 52 years old and is being treated at [facility].  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  A coronary calcium score performed/24 was 35.  She does have mild hyperlipidemia intolerant to statin therapy.  She was tried on Repatha which she did not tolerate as well as Zetia.   Current Meds  Medication Sig   albuterol (VENTOLIN HFA) 108 (90 Base) MCG/ACT inhaler SMARTSIG:1-2 Puff(s) Via Inhaler Every 6 Hours PRN   amLODipine (NORVASC) 2.5 MG tablet Take 2.5 mg by mouth daily.   amphetamine-dextroamphetamine (ADDERALL) 10 MG tablet Take 10 mg by mouth every morning.   ascorbic acid (VITAMIN C) 1000 MG tablet Take 1,000 mg by  mouth daily.   calcium carbonate (TUMS - DOSED IN MG ELEMENTAL CALCIUM) 500 MG chewable tablet Chew 1 tablet by mouth daily.   clobetasol ointment (TEMOVATE) 0.05 % Apply topically daily.   clonazePAM (KLONOPIN) 0.5 MG tablet Take 0.5 mg by mouth 2 (two) times daily as needed for anxiety.   cyclobenzaprine (FLEXERIL) 5 MG tablet Take 5 mg by mouth at bedtime as needed.   D-MANNOSE PO Take by mouth.   Evolocumab (REPATHA SURECLICK) 140 MG/ML SOAJ Inject 140 mg into the skin every 14 (fourteen) days.   ezetimibe (ZETIA) 10 MG tablet Take 10 mg by mouth daily.   gabapentin (NEURONTIN) 100 MG capsule Take 1-3 capsules (100-300 mg total) by mouth at bedtime.   hydrOXYzine (ATARAX) 25 MG tablet Take 25 mg by mouth daily.   levothyroxine (SYNTHROID) 125 MCG tablet Take 125 mcg by mouth daily.   Methenamine-Sodium Salicylate 162-162.5 MG TABS Take by mouth.   nitrofurantoin (MACRODANTIN) 50 MG capsule Take 50 mg by mouth daily at 6 PM.   triamcinolone (NASACORT) 55 MCG/ACT AERO nasal inhaler Place into the nose.   triamcinolone cream (KENALOG) 0.1 % Apply 1 Application topically 2 (two) times daily as needed.   WIXELA INHUB 250-50 MCG/DOSE AEPB 1 puff 2 (two) times daily.   zolpidem (AMBIEN) 5 MG tablet Take 5 mg by mouth at bedtime as needed.     Allergies  Allergen Reactions   Lortab [Hydrocodone-Acetaminophen] Other (See Comments)    Nausea, headache   Statins  CK >3600     Social History   Socioeconomic History   Marital status: Divorced    Spouse name: Not on file   Number of children: Not on file   Years of education: Not on file   Highest education level: Not on file  Occupational History   Not on file  Tobacco Use   Smoking status: Former    Types: Cigarettes   Smokeless tobacco: Never  Vaping Use   Vaping status: Never Used  Substance and Sexual Activity   Alcohol use: No    Alcohol/week: 0.0 standard drinks of alcohol   Drug use: Never   Sexual activity: Not  Currently  Other Topics Concern   Not on file  Social History Narrative   Not on file   Social Determinants of Health   Financial Resource Strain: Low Risk  (11/08/2023)   Received from [facility]   Overall Financial Resource Strain (CARDIA)    Difficulty of Paying Living Expenses: Not hard at all  Food Insecurity: No Food Insecurity (11/08/2023)   Received from [facility]   Hunger Vital Sign    Worried About Running Out of Food in the Last Year: Never true    Ran Out of Food in the Last Year: Never true  Transportation Needs: No Transportation Needs (11/08/2023)   Received from [facility] - Transportation    In the past 12 months, has lack of transportation kept you from medical appointments or from getting medications?: No    Lack of Transportation (Non-Medical): No  Physical Activity: Sufficiently Active (07/31/2023)   Exercise Vital Sign    Days of Exercise per Week: 3 days    Minutes of Exercise per Session: 60 min  Stress: Stress Concern Present (07/31/2023)   [employer] of Occupational Health - Occupational Stress Questionnaire    Feeling of Stress : To some extent  Social Connections: Moderately Integrated (07/31/2023)   Social Connection and Isolation Panel [NHANES]    Frequency of Communication with Friends and Family: More than three times a week    Frequency of Social Gatherings with Friends and Family: More than three times a week    Attends Religious Services: More than 4 times per year    Active Member of [employer] or Organizations: Yes    Attends Engineer, structural: More than 4 times per year    Marital Status: Divorced  Intimate Partner Violence: Not At Risk (07/31/2023)   Humiliation, Afraid, Rape, and Kick questionnaire    Fear of Current or Ex-Partner: No    Emotionally Abused: No    Physically Abused: No    Sexually Abused: No     Review of Systems: General: negative for chills,  fever, night sweats or weight changes.  Cardiovascular: negative for chest pain, dyspnea on exertion, edema, orthopnea, palpitations, paroxysmal nocturnal dyspnea or shortness of breath Dermatological: negative for rash Respiratory: negative for cough or wheezing Urologic: negative for hematuria Abdominal: negative for nausea, vomiting, diarrhea, bright red blood per rectum, melena, or hematemesis Neurologic: negative for visual changes, syncope, or dizziness All other systems reviewed and are otherwise negative except as noted above.    Blood pressure 137/78, pulse 72, height 5\' 8"  (1.727 m), weight 148 lb (67.1 kg), SpO2 96%.  General appearance: alert and no distress Neck: no adenopathy, no carotid bruit, no JVD, supple, symmetrical, trachea midline, and thyroid not enlarged, symmetric, no tenderness/mass/nodules Lungs: clear to auscultation bilaterally Heart:  regular rate and rhythm, S1, S2 normal, no murmur, click, rub or gallop Extremities: extremities normal, atraumatic, no cyanosis or edema Pulses: 2+ and symmetric Skin: Skin color, texture, turgor normal. No rashes or lesions Neurologic: Grossly normal  EKG not performed today      ASSESSMENT AND PLAN:   Hyperlipidemia History of hyperlipidemia with lipid profile performed 02/06/2023 revealing a total cholesterol of 243, LDL 125 and HDL 101.  She did have a coronary calcium score performed 06/04/2023 which was 35.  She is statin intolerant and had a reaction to Repatha.  And then refer her to Dr. Rennis Golden in our lipid clinic for alternative lipid-lowering interventions.  Mitral valve prolapse History of mild mitral valve prolapse by 2D echo 06/06/2023 with mild MR.  This does not need to be repeated.     Runell Gess MD FACP,FACC,FAHA, [facility] 11/12/2023 3:24 PM

## 2023-11-12 NOTE — Patient Instructions (Signed)

## 2023-11-12 NOTE — Assessment & Plan Note (Signed)
History of hyperlipidemia with lipid profile performed 02/06/2023 revealing a total cholesterol of 243, LDL 125 and HDL 101.  She did have a coronary calcium score performed 06/04/2023 which was 35.  She is statin intolerant and had a reaction to Repatha.  And then refer her to Dr. Rennis Golden in our lipid clinic for alternative lipid-lowering interventions.

## 2023-11-12 NOTE — Assessment & Plan Note (Signed)
History of mild mitral valve prolapse by 2D echo 06/06/2023 with mild MR.  This does not need to be repeated.

## 2023-12-04 ENCOUNTER — Encounter: Payer: Self-pay | Admitting: Cardiovascular Disease

## 2023-12-04 DIAGNOSIS — E782 Mixed hyperlipidemia: Secondary | ICD-10-CM

## 2023-12-04 DIAGNOSIS — R931 Abnormal findings on diagnostic imaging of heart and coronary circulation: Secondary | ICD-10-CM

## 2023-12-05 NOTE — Telephone Encounter (Signed)
Spoke to patient and informed patient that there are no available appointments at this time for Lipid Clinic. Also made the the patient aware that the next schedule starting March 2025 for Lipid clinic is not available yet. Advised the patient to call back to see if there are any cancellations or to see if the new schedule is available. Patient voiced an understanding.

## 2023-12-08 ENCOUNTER — Encounter: Payer: Self-pay | Admitting: Family Medicine

## 2023-12-10 ENCOUNTER — Ambulatory Visit (INDEPENDENT_AMBULATORY_CARE_PROVIDER_SITE_OTHER): Payer: Medicare Other | Admitting: Family Medicine

## 2023-12-10 ENCOUNTER — Encounter: Payer: Self-pay | Admitting: Family Medicine

## 2023-12-10 VITALS — BP 140/80 | HR 73 | Temp 97.6°F | Ht 68.0 in | Wt 144.7 lb

## 2023-12-10 DIAGNOSIS — H33311 Horseshoe tear of retina without detachment, right eye: Secondary | ICD-10-CM | POA: Diagnosis not present

## 2023-12-10 DIAGNOSIS — E782 Mixed hyperlipidemia: Secondary | ICD-10-CM | POA: Diagnosis not present

## 2023-12-10 DIAGNOSIS — H04122 Dry eye syndrome of left lacrimal gland: Secondary | ICD-10-CM | POA: Diagnosis not present

## 2023-12-10 DIAGNOSIS — L03211 Cellulitis of face: Secondary | ICD-10-CM

## 2023-12-10 DIAGNOSIS — H903 Sensorineural hearing loss, bilateral: Secondary | ICD-10-CM | POA: Diagnosis not present

## 2023-12-10 MED ORDER — CYCLOSPORINE 0.05 % OP EMUL: 1.0000 [drp] | Freq: Two times a day (BID) | OPHTHALMIC | 2 refills | Status: DC

## 2023-12-10 MED ORDER — DOXYCYCLINE HYCLATE 100 MG PO TABS
100.0000 mg | ORAL_TABLET | Freq: Two times a day (BID) | ORAL | 0 refills | Status: AC
Start: 2023-12-10 — End: 2023-12-17

## 2023-12-10 MED ORDER — MUPIROCIN 2 % EX OINT: 1.0000 | TOPICAL_OINTMENT | Freq: Two times a day (BID) | CUTANEOUS | 0 refills | Status: DC

## 2023-12-10 NOTE — Progress Notes (Signed)
Established Patient Office Visit  Subjective   Patient ID: Tanya Snyder, female    DOB: 1951-08-27  Age: 72 y.o. MRN: [id number]  Chief Complaint  Patient presents with   Facial Swelling    Patient complains of right-sided facial swelling and redness x2 days, no known injury, tried an antihistamine as her ear was bothering her, also states she had the same problem years ago and was diagnosed with an infection    Patient reports that she had 2 day history of right sided facial redness and swelling. States that she took phenylephrine and put some antibacterial ointment in her nostrils and it seemed to get some better. States that there is no pain, maybe subjective chills, no issues with her teeth on that side, no problems with sore throat, no ear pain or difficulty swallowing.   Pt is requesting new referral to ENT -- states that she has hearing loss and needs new hearing aids, would like to be referred to Dr Izola Price.   Dry eye-- pt reports that she saw Dr. Dione Booze, then saw Dr. Vanessa Jeffery Gammell, states that she has a small retinal tear and they offered to laser it for her. States that she was not happy with her care and they did not discuss her dry eye. States that she has been using multiple different types of OTC eye drops without any improvement.     Current Outpatient Medications  Medication Instructions   albuterol (VENTOLIN HFA) 108 (90 Base) MCG/ACT inhaler SMARTSIG:1-2 Puff(s) Via Inhaler Every 6 Hours PRN   amLODipine (NORVASC) 2.5 mg, Daily   amphetamine-dextroamphetamine (ADDERALL) 10 MG tablet 10 mg, Every morning   ascorbic acid (VITAMIN C) 1,000 mg, Daily   calcium carbonate (TUMS - DOSED IN MG ELEMENTAL CALCIUM) 500 MG chewable tablet 1 tablet, Daily   clobetasol ointment (TEMOVATE) 0.05 % Daily   clonazePAM (KLONOPIN) 0.5 mg, 2 times daily PRN   cyclobenzaprine (FLEXERIL) 5 mg, At bedtime PRN   cycloSPORINE (RESTASIS) 0.05 % ophthalmic emulsion 1 drop, Both Eyes, 2 times  daily   D-MANNOSE PO Take by mouth.   doxycycline (VIBRA-TABS) 100 mg, Oral, 2 times daily   ezetimibe (ZETIA) 10 mg, Daily   gabapentin (NEURONTIN) 100-300 mg, Oral, Daily at bedtime   hydrOXYzine (ATARAX) 25 mg, Daily   levothyroxine (SYNTHROID) 125 mcg, Daily   Methenamine-Sodium Salicylate 162-162.5 MG TABS Take by mouth.   mupirocin ointment (BACTROBAN) 2 % 1 Application, Topical, 2 times daily   nitrofurantoin (MACRODANTIN) 50 mg, Daily-1800   Repatha SureClick 140 mg, Subcutaneous, Every 14 days   triamcinolone (NASACORT) 55 MCG/ACT AERO nasal inhaler Place into the nose.   triamcinolone cream (KENALOG) 0.1 % 1 Application, Topical, 2 times daily PRN   WIXELA INHUB 250-50 MCG/DOSE AEPB 1 puff, 2 times daily   zolpidem (AMBIEN) 5 mg, At bedtime PRN    Patient Active Problem List   Diagnosis Date Noted   Allergic rhinitis due to animal (cat) (dog) hair and dander 08/22/2023   Allergic rhinitis due to pollen 08/22/2023   Generalized pruritus 08/22/2023   Statin myopathy 07/12/2023   Agatston CAC score, <100 06/12/2023   Atypical chest pain 04/30/2023   Cataracts, bilateral 03/29/2023   Dry eye 03/29/2023   Prediabetes 08/14/2022   Pain in joint of right hip 06/08/2021   Numbness in feet 06/05/2021   Imbalance 06/05/2021   Dizziness 06/05/2021   Hx of colonic polyps    Polyp of descending colon    Neuropathy 02/14/2021  Mild persistent asthma, uncomplicated 02/07/2021   Allergic rhinitis 02/07/2021   Palpitations 10/25/2020   Asthma without status asthmaticus 07/05/2020   Depression 07/05/2020   Hyperlipidemia 07/05/2020   Mitral valve prolapse 06/05/2019   Status post right mastectomy 03/18/2019   Bilateral renal masses 08/30/2018   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   History of recurrent UTIs 12/21/2016   Chronic bilateral low back pain without sciatica 08/16/2016   Enthesopathy of right hip 08/07/2016   Mid back pain, chronic  04/29/2015   Personal history of breast cancer 04/29/2015   Malignant neoplasm of right female breast (HCC) 01/07/2015   Lower urinary tract infectious disease 05/24/2014   Pain in thoracic spine 04/14/2012   Idiopathic scoliosis and kyphoscoliosis 04/14/2012   Pain in joint, pelvic region and thigh 12/13/2005   Other specified acquired hypothyroidism 12/04/2004   Acute sinusitis, unspecified 12/04/2004   Hypoglycemia, unspecified 10/23/2004      Review of Systems  All other systems reviewed and are negative.     Objective:     BP (!) 140/80   Pulse 73   Temp 97.6 F (36.4 C) (Oral)   Ht 5\' 8"  (1.727 m)   Wt 144 lb 11.2 oz (65.6 kg)   SpO2 95%   BMI 22.00 kg/m    Physical Exam Vitals reviewed.  Constitutional:      Appearance: Normal appearance.  HENT:     Head:     Jaw: No tenderness, swelling or pain on movement.     Comments: There is a slight asymmetry in the right lower cheek vs. The left    Right Ear: Tympanic membrane and ear canal normal.     Left Ear: Tympanic membrane and ear canal normal.     Mouth/Throat:     Mouth: Mucous membranes are moist.     Pharynx: No posterior oropharyngeal erythema.  Pulmonary:     Effort: Pulmonary effort is normal.     Breath sounds: Normal breath sounds. No wheezing.  Musculoskeletal:     Cervical back: Neck supple.  Lymphadenopathy:     Cervical: No cervical adenopathy.  Neurological:     Mental Status: She is alert and oriented to person, place, and time. Mental status is at baseline.      No results found for any visits on 12/10/23.    The ASCVD Risk score (Arnett DK, et al., 2019) failed to calculate for the following reasons:   Cannot find a previous HDL lab   Cannot find a previous total cholesterol lab    Assessment & Plan:  Facial cellulitis Questionable-- I do not see a lot of redness or swelling today, there is minimal tenderness to palpation of the right lower cheek. Will treat empirically wit h7  days of doxycycline BID.  -     Mupirocin; Apply 1 Application topically 2 (two) times daily.  Dispense: 22 g; Refill: 0 -     Doxycycline Hyclate; Take 1 tablet (100 mg total) by mouth 2 (two) times daily for 7 days.  Dispense: 14 tablet; Refill: 0  Mixed hyperlipidemia Needs lipid panel before seeing Dr. Rennis Golden in April. Orders placed -     Lipid panel; Future  Sensorineural hearing loss (SNHL) of both ears -     Ambulatory referral to ENT  Chronic dryness of left eye Patient is asking for a  second opinion about this plus the retinal tear, is asking for a new referral to ophthalmology. Since she failed OTC eye drops  will rx restasis for her to try.  -     Ambulatory referral to Ophthalmology -     cycloSPORINE; Place 1 drop into both eyes 2 (two) times daily.  Dispense: 0.4 mL; Refill: 2  Retinal tear of right eye -     Ambulatory referral to Ophthalmology   I spent 30 minutes with the patient today discussing her symptoms, reviewing her consult progress notes, etc.   No follow-ups on file.    Karie Georges, MD

## 2023-12-10 NOTE — Telephone Encounter (Signed)
Can she have the bloodwork done at our office?

## 2023-12-16 ENCOUNTER — Other Ambulatory Visit (HOSPITAL_COMMUNITY): Payer: Self-pay

## 2023-12-16 ENCOUNTER — Other Ambulatory Visit: Payer: Self-pay | Admitting: Family Medicine

## 2023-12-16 DIAGNOSIS — G8929 Other chronic pain: Secondary | ICD-10-CM

## 2023-12-16 MED ORDER — GABAPENTIN 100 MG PO CAPS
100.0000 mg | ORAL_CAPSULE | Freq: Every day | ORAL | 0 refills | Status: DC
  Filled 2023-12-16: qty 90, 30d supply, fill #0

## 2023-12-20 ENCOUNTER — Other Ambulatory Visit (HOSPITAL_COMMUNITY): Payer: Self-pay

## 2023-12-24 ENCOUNTER — Encounter (INDEPENDENT_AMBULATORY_CARE_PROVIDER_SITE_OTHER): Payer: Self-pay | Admitting: Otolaryngology

## 2023-12-30 DIAGNOSIS — H2513 Age-related nuclear cataract, bilateral: Secondary | ICD-10-CM | POA: Diagnosis not present

## 2024-01-22 ENCOUNTER — Telehealth (INDEPENDENT_AMBULATORY_CARE_PROVIDER_SITE_OTHER): Payer: Self-pay | Admitting: Otolaryngology

## 2024-01-22 ENCOUNTER — Telehealth (INDEPENDENT_AMBULATORY_CARE_PROVIDER_SITE_OTHER): Payer: Self-pay | Admitting: Audiology

## 2024-01-22 NOTE — Telephone Encounter (Signed)
Reminder Call: Date: 01/23/2024 Status: Sch  Time: 11:30 AM [address] [place], [place] [address]  Confirmed time and location w/patient.

## 2024-01-22 NOTE — Telephone Encounter (Signed)
Reminder Call: Date: 01/23/2024 Status: Sch  Time: 11:00 AM [address] [place], [place] [address]  Confirmed time and location w/patient.

## 2024-01-23 ENCOUNTER — Encounter (INDEPENDENT_AMBULATORY_CARE_PROVIDER_SITE_OTHER): Payer: Self-pay

## 2024-01-23 ENCOUNTER — Ambulatory Visit (INDEPENDENT_AMBULATORY_CARE_PROVIDER_SITE_OTHER): Payer: Medicare Other | Admitting: Otolaryngology

## 2024-01-23 ENCOUNTER — Ambulatory Visit (INDEPENDENT_AMBULATORY_CARE_PROVIDER_SITE_OTHER): Payer: Medicare Other | Admitting: Audiology

## 2024-01-23 VITALS — BP 114/68 | HR 69 | Resp 19 | Wt 144.0 lb

## 2024-01-23 DIAGNOSIS — H6121 Impacted cerumen, right ear: Secondary | ICD-10-CM

## 2024-01-23 DIAGNOSIS — H903 Sensorineural hearing loss, bilateral: Secondary | ICD-10-CM

## 2024-01-23 DIAGNOSIS — H6991 Unspecified Eustachian tube disorder, right ear: Secondary | ICD-10-CM | POA: Diagnosis not present

## 2024-01-23 NOTE — Progress Notes (Signed)
  [address], [address] [place], [place] [address] [phone]  Audiological Evaluation    Name: Tanya Snyder     DOB:   [birth year]      MRN:   [id number]                                                                                     Service Date: 01/23/2024     Accompanied by: unaccompanied    Patient comes today after Dr. Allena Katz, ENT sent a referral for a hearing evaluation due to concerns with hearing loss.   Symptoms Yes Details  Hearing loss  [x]  Struggles to hear/understand what others say at home/friends.  Tinnitus  []    Ear pain/ Ear infections  []    Balance problems  []    Noise exposure  []    Previous ear surgeries  []    Family history  []    Amplification  [x]  Reports had a hearing aid consult at [facility] Th[facility] and then decided to purchase hearing aids/amplifiers online through "[facility]".  Other  [x]  Non -occluding wax in both ears.    Otoscopy: Right ear: Clear external ear canals and notable landmarks visualized on the tympanic membrane. Left ear:  Clear external ear canals and notable landmarks visualized on the tympanic membrane.  Tympanometry: Right ear: Type A- Normal external ear canal volume with normal middle ear pressure and tympanic membrane compliance Left ear: Type A- Normal external ear canal volume with normal middle ear pressure and tympanic membrane compliance    Pure tone Audiometry: Right ear- Normal to moderate sensorineural hearing loss from [phone] Hz.   Left ear-  Normal to moderately severe sensorineural hearing loss from [phone] Hz.    The hearing test results were completed under headphones and results are deemed to be of good reliability. Test technique:  conventional     Speech Audiometry: Right ear- Speech Reception Threshold (SRT) was obtained at 20 dBHL Left ear-Speech Reception Threshold (SRT) was obtained at 20 dBHL   Word Recognition Score Tested using NU-6 (MLV) Right ear: 92% was  obtained at a presentation level of 70 dBHL with contralateral masking which is deemed as  excellent Left ear: 80% was obtained at a presentation level of 70 dBHL with contralateral masking which is deemed as  excellent   Recommendations: Follow up with ENT as scheduled for today. Return for a hearing evaluation if concerns with hearing changes arise or per MD recommendation. Consider a communication needs assessment after medical clearance for hearing aids is obtained.   Kianna Billet MARIE LEROUX-MARTINEZ, AUD

## 2024-01-26 ENCOUNTER — Encounter: Payer: Self-pay | Admitting: Family Medicine

## 2024-01-26 DIAGNOSIS — E782 Mixed hyperlipidemia: Secondary | ICD-10-CM

## 2024-01-26 DIAGNOSIS — Z853 Personal history of malignant neoplasm of breast: Secondary | ICD-10-CM

## 2024-01-26 NOTE — Progress Notes (Signed)
Dear Dr. Casimiro Needle, Here is my assessment for our mutual patient, Tanya Snyder. Thank you for allowing me the opportunity to care for your patient. Please do not hesitate to contact me should you have any other questions. Sincerely, Dr. Jovita Kussmaul  Otolaryngology Clinic Note Referring provider: Dr. Casimiro Needle HPI:  Tanya Snyder is a 73 y.o. female kindly referred by Dr. Casimiro Needle for evaluation of hearing loss.  Patient reports: hearing decline and struggle to hear/understand others, especially in noisy environments. Prior seen at [facility] by Dr. Marella Chimes who did a right tymp tube in Feb 2023 for ETD. She reported that it was done due to clicking noise/ETD symptoms and had an MRI for dizziness. Since the tube, she has done well from that standpoint and her dizziness and ETD symptoms have resolved. Tube was placed posterior inferior.  Patient currently denies: ear pain, fullness, vertigo, drainage Patient additionally denies: deep pain in ear canal, eustachian tube symptoms such as popping/crackling, sensitive to pressure changes Patient also denies barotrauma, vestibular suppressant use, ototoxic medication use Prior ear surgery: BTT (2023); Sinus surgery  H&N Surgery: see above Personal or FHx of bleeding dz or anesthesia difficulty: no   Tobacco: former, quit; Lives in [place], [place]  Independent Review of Additional Tests or Records:  12/2023 Tanya Snyder was independently reviewed and interpreted by me and it reveals: A/A tymps; b/l symmetric sloping SNHL; 92% WRT and 80% WRT AD/AS at 70 dB   SNHL= Sensorineural hearing loss  MRI IAC 04/12/2021 independently reviewed and interpreted with attention to ears: no retrocochlear lesions; no mastoid effusion CT Temporal bone 2020 independently reviewed and interpeted as well: no significant mastoid or ME disease; labyrinth (bony) unremarkable and ossicles as well  12/10/2023: Dr. Casimiro Needle (FM) - noted b/l SNHL; Ref to  ENT  PMH/Meds/All/SocHx/FamHx/ROS:   Past Medical History:  Diagnosis Date   Asthma    Breast cancer (HCC)    Cancer (HCC)    Depression    Dysrhythmia    History of chicken pox    Hyperlipemia    Hypothyroidism    Scoliosis      Past Surgical History:  Procedure Laterality Date   ABDOMINAL HYSTERECTOMY     CHOLECYSTECTOMY     COLONOSCOPY WITH PROPOFOL N/A 09/01/2015   Procedure: COLONOSCOPY WITH PROPOFOL;  Surgeon: Elnita Maxwell, MD;  Location: [facility] ENDOSCOPY;  Service: Endoscopy;  Laterality: N/A;   COLONOSCOPY WITH PROPOFOL N/A 03/21/2021   Procedure: COLONOSCOPY WITH PROPOFOL;  Surgeon: Midge Minium, MD;  Location: [facility] ENDOSCOPY;  Service: Endoscopy;  Laterality: N/A;   MASTECTOMY     MYRINGOTOMY WITH TUBE PLACEMENT Right 01/24/2022   Procedure: MYRINGOTOMY WITH TUBE PLACEMENT;  Surgeon: Bud Face, MD;  Location: [facility];  Service: ENT;  Laterality: Right;  pt. request later in morning 8 am   nare polypectomy Left    SPINAL FUSION     UPPER GI ENDOSCOPY      Family History  Problem Relation Age of Onset   Multiple sclerosis Mother    Prostate cancer Maternal Uncle    Colon cancer Maternal Uncle    Colon cancer Maternal Grandmother      Social Connections: Moderately Integrated (07/31/2023)   Social Connection and Isolation Panel [NHANES]    Frequency of Communication with Friends and Family: More than three times a week    Frequency of Social Gatherings with Friends and Family: More than three times a week    Attends Religious Services: More than 4 times per year  Active Member of Clubs or Organizations: Yes    Attends Banker Meetings: More than 4 times per year    Marital Status: Divorced      Current Outpatient Medications:    albuterol (VENTOLIN HFA) 108 (90 Base) MCG/ACT inhaler, SMARTSIG:1-2 Puff(s) Via Inhaler Every 6 Hours PRN, Disp: , Rfl:    amLODipine (NORVASC) 2.5 MG tablet, Take 2.5 mg by mouth daily., Disp: ,  Rfl:    amphetamine-dextroamphetamine (ADDERALL) 10 MG tablet, Take 10 mg by mouth every morning., Disp: , Rfl:    ascorbic acid (VITAMIN C) 1000 MG tablet, Take 1,000 mg by mouth daily., Disp: , Rfl:    calcium carbonate (TUMS - DOSED IN MG ELEMENTAL CALCIUM) 500 MG chewable tablet, Chew 1 tablet by mouth daily., Disp: , Rfl:    clobetasol ointment (TEMOVATE) 0.05 %, Apply topically daily., Disp: , Rfl:    clonazePAM (KLONOPIN) 0.5 MG tablet, Take 0.5 mg by mouth 2 (two) times daily as needed for anxiety., Disp: , Rfl:    cyclobenzaprine (FLEXERIL) 5 MG tablet, Take 5 mg by mouth at bedtime as needed., Disp: , Rfl:    cycloSPORINE (RESTASIS) 0.05 % ophthalmic emulsion, Place 1 drop into both eyes 2 (two) times daily., Disp: 0.4 mL, Rfl: 2   D-MANNOSE PO, Take by mouth., Disp: , Rfl:    Evolocumab (REPATHA SURECLICK) 140 MG/ML SOAJ, Inject 140 mg into the skin every 14 (fourteen) days., Disp: 6 mL, Rfl: 1   ezetimibe (ZETIA) 10 MG tablet, Take 10 mg by mouth daily., Disp: , Rfl:    gabapentin (NEURONTIN) 100 MG capsule, Take 1-3 capsules (100-300 mg total) by mouth at bedtime., Disp: 90 capsule, Rfl: 0   hydrOXYzine (ATARAX) 25 MG tablet, Take 25 mg by mouth daily., Disp: , Rfl:    levothyroxine (SYNTHROID) 125 MCG tablet, Take 125 mcg by mouth daily., Disp: , Rfl:    Methenamine-Sodium Salicylate 162-162.5 MG TABS, Take by mouth., Disp: , Rfl:    mupirocin ointment (BACTROBAN) 2 %, Apply 1 Application topically 2 (two) times daily., Disp: 22 g, Rfl: 0   nitrofurantoin (MACRODANTIN) 50 MG capsule, Take 50 mg by mouth daily at 6 PM., Disp: , Rfl:    triamcinolone (NASACORT) 55 MCG/ACT AERO nasal inhaler, Place into the nose., Disp: , Rfl:    triamcinolone cream (KENALOG) 0.1 %, Apply 1 Application topically 2 (two) times daily as needed., Disp: 45 g, Rfl: 0   WIXELA INHUB 250-50 MCG/DOSE AEPB, 1 puff 2 (two) times daily., Disp: , Rfl:    zolpidem (AMBIEN) 5 MG tablet, Take 5 mg by mouth at bedtime  as needed., Disp: , Rfl:    Physical Exam:   BP 114/68 (BP Location: Left Arm, Patient Position: Sitting, Cuff Size: Normal)   Pulse 69   Resp 19   Wt 144 lb (65.3 kg)   SpO2 90%   BMI 21.90 kg/m   Salient findings:  CN II-XII intact Given history and complaints, ear microscopy was indicated and performed for evaluation with findings as below in physical exam section and in procedures; canals quite narrow; bilateral cerumen, right impacted onto TM and unable to visualize TM as a result; after removal, Bilateral EAC clear and TM intact with well pneumatized middle ear spaces; of note, right TM also had a PE tube which was in canal, this was also removed Weber 512: mid Rinne 512: AC > BC b/l  Anterior rhinoscopy: Septum intact; bilateral inferior turbinates without significant hypertrophy No lesions  of oral cavity/oropharynx No obviously palpable neck masses/lymphadenopathy/thyromegaly No respiratory distress or stridor  Seprately Identifiable Procedures:  Procedure: Bilateral ear microscopy and cerumen removal using microscope (CPT [id number]) - Mod 25 Pre-procedure diagnosis: Cerumen impaction right external ears Post-procedure diagnosis: same Indication: right cerumen impaction with quite narrow EAC; given patient's otologic complaints and history as well as for improved and comprehensive examination of external ear and tympanic membrane, bilateral otologic examination using microscope was performed and impacted cerumen removed  Procedure: Patient was placed semi-recumbent. Both ear canals were examined using the microscope with findings above. Impacted Cerumen removed on  right using currette and suction with improvement in patency; ear tube was resting on top of TM and this also removed Patient tolerated the procedure well.        Impression & Plans:  Tanya Snyder is a 73 y.o. female with:  1. Sensorineural hearing loss (SNHL), bilateral   2. Dysfunction of right  eustachian tube    Symmetric SNHL, prior CT and MRI reassuring ETD symptoms improved after PE tube placement; recommend continued observation; TM otherwise intact; autoinsufflate if symptoms return Audio today reassuring overall without asymmetry except for SNHL D/w pt re: options incl HA and amplification D/w pt on f/u, advised to contact if hearing changes and f/u PRN  See below regarding exact medications prescribed this encounter including dosages and route: No orders of the defined types were placed in this encounter.     Thank you for allowing me the opportunity to care for your patient. Please do not hesitate to contact me should you have any other questions.  Sincerely, Jovita Kussmaul, MD Otolaryngologist (ENT), [facility] Phone: [phone] Fax: [phone]  01/26/2024, 12:38 PM   MDM:  Level 4 99204 Complexity/Problems addressed: mod - multiple chronic problems, stable Data complexity: mod - independent review of note; independent interpretation of MRI and CT - Morbidity: low  - Prescription Drug prescribed or managed: no

## 2024-01-27 ENCOUNTER — Telehealth: Payer: Self-pay | Admitting: Cardiovascular Disease

## 2024-01-27 NOTE — Telephone Encounter (Signed)
NMR lipopofile and LPa ordered. MyChart message sent to patient.

## 2024-01-27 NOTE — Telephone Encounter (Signed)
Patient is requesting orders to have lab work prior to 4/04 appointment with Dr. Rennis Golden.

## 2024-01-27 NOTE — Telephone Encounter (Signed)
Message sent to referring MD/RN for orders

## 2024-01-27 NOTE — Addendum Note (Signed)
Addended by: Lindell Spar on: 01/27/2024 12:38 PM   Modules accepted: Orders

## 2024-01-28 NOTE — Telephone Encounter (Signed)
Ok to place orders for fasting lipid panel, CA 125

## 2024-01-30 ENCOUNTER — Other Ambulatory Visit: Payer: Self-pay | Admitting: Family Medicine

## 2024-01-30 DIAGNOSIS — M545 Low back pain, unspecified: Secondary | ICD-10-CM

## 2024-01-30 MED ORDER — GABAPENTIN 100 MG PO CAPS: 100.0000 mg | ORAL_CAPSULE | Freq: Every day | ORAL | 0 refills | Status: DC

## 2024-01-30 NOTE — Telephone Encounter (Signed)
 Patient needs refill of:  gabapentin  (NEURONTIN ) 100 MG capsule   [id number] - [place], [place] - [address] AT [facility] OF [address] Phone: [phone]  Fax: [phone]

## 2024-01-30 NOTE — Telephone Encounter (Signed)
 Patient has upcoming appointment with you on 02812/24 at 8:40 AM. Do you want me to give just enough to make to appointment and then you'll refill or just give her another 30 day supply?

## 2024-01-30 NOTE — Telephone Encounter (Signed)
Ok to give 30-day supply.

## 2024-02-03 MED ORDER — GABAPENTIN 100 MG PO CAPS: 100.0000 mg | ORAL_CAPSULE | Freq: Every day | ORAL | 0 refills | Status: DC

## 2024-02-03 NOTE — Telephone Encounter (Signed)
 Rx done.

## 2024-02-03 NOTE — Addendum Note (Signed)
 Addended by: Henry Loge on: 02/03/2024 02:58 PM   Modules accepted: Orders

## 2024-02-05 ENCOUNTER — Other Ambulatory Visit: Payer: Medicare Other

## 2024-02-17 DIAGNOSIS — H905 Unspecified sensorineural hearing loss: Secondary | ICD-10-CM | POA: Diagnosis not present

## 2024-02-24 DIAGNOSIS — Z0000 Encounter for general adult medical examination without abnormal findings: Secondary | ICD-10-CM | POA: Diagnosis not present

## 2024-02-24 DIAGNOSIS — Z9011 Acquired absence of right breast and nipple: Secondary | ICD-10-CM | POA: Diagnosis not present

## 2024-02-24 DIAGNOSIS — Z853 Personal history of malignant neoplasm of breast: Secondary | ICD-10-CM | POA: Diagnosis not present

## 2024-02-24 DIAGNOSIS — R7309 Other abnormal glucose: Secondary | ICD-10-CM | POA: Diagnosis not present

## 2024-02-24 DIAGNOSIS — E7800 Pure hypercholesterolemia, unspecified: Secondary | ICD-10-CM | POA: Diagnosis not present

## 2024-02-24 DIAGNOSIS — H9190 Unspecified hearing loss, unspecified ear: Secondary | ICD-10-CM | POA: Diagnosis not present

## 2024-03-06 ENCOUNTER — Telehealth: Payer: Self-pay

## 2024-03-06 ENCOUNTER — Ambulatory Visit: Payer: Self-pay | Admitting: Family Medicine

## 2024-03-06 ENCOUNTER — Ambulatory Visit (INDEPENDENT_AMBULATORY_CARE_PROVIDER_SITE_OTHER): Admitting: Family Medicine

## 2024-03-06 VITALS — BP 120/80 | HR 68 | Temp 97.6°F | Ht 68.0 in | Wt 147.4 lb

## 2024-03-06 DIAGNOSIS — J3081 Allergic rhinitis due to animal (cat) (dog) hair and dander: Secondary | ICD-10-CM

## 2024-03-06 DIAGNOSIS — J309 Allergic rhinitis, unspecified: Secondary | ICD-10-CM

## 2024-03-06 DIAGNOSIS — H9201 Otalgia, right ear: Secondary | ICD-10-CM

## 2024-03-06 DIAGNOSIS — J301 Allergic rhinitis due to pollen: Secondary | ICD-10-CM | POA: Diagnosis not present

## 2024-03-06 MED ORDER — PREDNISONE 10 MG PO TABS
ORAL_TABLET | ORAL | 0 refills | Status: AC
Start: 2024-03-06 — End: 2024-03-12

## 2024-03-06 NOTE — Telephone Encounter (Signed)
 Noted- ok to close.

## 2024-03-06 NOTE — Telephone Encounter (Signed)
 Patient was seen by Mardene Celeste this morning.

## 2024-03-06 NOTE — Telephone Encounter (Signed)
 Spoke with the patient and informed her our office does not perform labs ordered by another office.  Advised she contact Dr Blanchie Dessert office for lab recommendations.

## 2024-03-06 NOTE — Patient Instructions (Signed)
-  Prescribed Prednisone 10mg , 6 day taper. Please do not take any NSAIDS, such as Motrin, Ibuprofen, Aleve, or Naproxen while taking Prednisone. -Placed a referral to ENT and Allergist. Please call the office or send a MyChart message if you do not receive a phone call or MyChart message in 2 weeks about appointment.  -Take care and hope you get to feeling better.

## 2024-03-06 NOTE — Telephone Encounter (Signed)
 Pt is here for an acute visit with Zandra Abts, NP. Pt brought up that she needs a fasting blood work done on Lipoprotein A and NMR, lipoprofile. She states it was originally order by her Cardiology. They send over lab order through mail to go to [facility].   Pt states she doesn't want to go to [facility] and would like to do her fasting lab here. Pt states she is not fasting today and would like to do her lab on last day of March or 4/1 before her visit with Dr. Rennis Golden.   Please advise on the order.

## 2024-03-06 NOTE — Progress Notes (Signed)
 Acute Office Visit   Subjective:  Patient ID: Tanya Snyder, female    DOB: 1951-10-31, 73 y.o.   MRN: [id number]  Chief Complaint  Patient presents with   Ear Pain    Pt c/o ear pain on R ear. Shooting pain. Pt states it has been going on for couple of wks. Pt states her ear pain woke her up last night and pain has been consistent. Denied sore throat, chills, fever, congestion. Pt added she has hearing aid on both ears.     HPI Patient is here for an acute visit. Patient is experiencing right ear pain. Patient reports the pain started last night. Described as sharp and shooting pain. Constant. She reports she applied cool compresses to her ear last night and had to take Tylenol PM to go back to sleep. She reports the pain has improved some this. She took Equate Severe Allergy pill this morning. Also, reports she has had sensation change that starts at your ear radiates to her scalp, head.  Denies sore throat, chills, fever, nasal congestion, but had post nasal drip since breast cancer.  She last seen Dr. Jovita Kussmaul with [facility] for sensorineural hearing loss, dysfunction of right eustachian tube, and impacted right ear. Prior to that she had seen [facility] by Dr Marella Chimes who did a right tymp tube in Feb 2023.   ROS See HPI above      Objective:   BP 120/80 (BP Location: Left Arm, Patient Position: Sitting, Cuff Size: Normal)   Pulse 68   Temp 97.6 F (36.4 C) (Oral)   Ht 5\' 8"  (1.727 m)   Wt 147 lb 6.4 oz (66.9 kg)   SpO2 96%   BMI 22.41 kg/m    Physical Exam Vitals reviewed.  Constitutional:      General: She is not in acute distress.    Appearance: Normal appearance. She is not ill-appearing, toxic-appearing or diaphoretic.  HENT:     Head: Normocephalic and atraumatic.     Right Ear: Tympanic membrane, ear canal and external ear normal. There is no impacted cerumen.     Left Ear: Tympanic membrane, ear canal and external ear normal. There is no  impacted cerumen.     Ears:     Comments: TM tube looks stable at 9 o'clock.  Eyes:     General:        Right eye: No discharge.        Left eye: No discharge.     Conjunctiva/sclera: Conjunctivae normal.  Cardiovascular:     Rate and Rhythm: Normal rate and regular rhythm.     Heart sounds: Normal heart sounds. No murmur heard.    No friction rub. No gallop.  Pulmonary:     Effort: Pulmonary effort is normal. No respiratory distress.     Breath sounds: Normal breath sounds.  Musculoskeletal:        General: Normal range of motion.  Lymphadenopathy:     Head:     Right side of head: Preauricular adenopathy present. No posterior auricular adenopathy.     Left side of head: No preauricular or posterior auricular adenopathy.  Skin:    General: Skin is warm and dry.  Neurological:     General: No focal deficit present.     Mental Status: She is alert and oriented to person, place, and time. Mental status is at baseline.  Psychiatric:        Mood and Affect: Mood normal.  Behavior: Behavior normal.        Thought Content: Thought content normal.        Judgment: Judgment normal.       Assessment & Plan:  Right ear pain -     predniSONE; Take 6 tablets (60 mg total) by mouth daily with breakfast for 1 day, THEN 5 tablets (50 mg total) daily with breakfast for 1 day, THEN 4 tablets (40 mg total) daily with breakfast for 1 day, THEN 3 tablets (30 mg total) daily with breakfast for 1 day, THEN 2 tablets (20 mg total) daily with breakfast for 1 day, THEN 1 tablet (10 mg total) daily with breakfast for 1 day.  Dispense: 21 tablet; Refill: 0 -     Ambulatory referral to ENT  Seasonal allergic rhinitis due to pollen -     Ambulatory referral to Allergy  Allergic rhinitis due to animal (cat) (dog) hair and dander -     Ambulatory referral to Allergy  Allergic rhinitis, unspecified seasonality, unspecified trigger -     Ambulatory referral to Allergy  -Prescribed Prednisone  10mg , 6 day taper. Advised to not take any NSAIDS, such as Motrin, Ibuprofen, Aleve, or Naproxen while taking Prednisone. No infection seen on physical exam. She reports she has had this similar pain prior with right ear history, but questioned whether she had some trigeminal neuralgia with the pain and sensation change.  -Placed a referral to ENT and Allergist. Patient is requesting a second opinion for ENT and requesting a referral to Allergist.    Zandra Abts, NP

## 2024-03-06 NOTE — Telephone Encounter (Signed)
  Chief Complaint: ear pain  Symptoms: pain in ear after tube removal  Pertinent Negatives: Patient denies sinus symptoms Disposition: [] ED /[] Urgent Care (no appt availability in office) / [x] Appointment(In office/virtual)/ []  [facility]/ [] Home Care/ [] Refused Recommended Disposition /[] [facility]/ []  Follow-up with PCP Additional Notes: Patient has been scheduled for evaluation    Copied from CRM [id number]. Topic: Clinical - Red Word Triage >> Mar 06, 2024  9:01 AM Efraim Kaufmann C wrote: [facility] that prompted transfer to Nurse Triage: patient is having horrible ear pain. She had a eustachian tube put in in 2022 and had it taken out last month by an ENT. She fears she has an ear infection. Her daughter also has this late in life ear problem where tubes had to be put in her ears so it is congenital however, she is having terrible ear pain. Reason for Disposition  Earache  (Exceptions: brief ear pain of < 60 minutes duration, earache occurring during air travel  Answer Assessment - Initial Assessment Questions 1. LOCATION: "Which ear is involved?"     Right ear 2. ONSET: "When did the ear start hurting"      Patient had tube in ear- was referred to ENT- they removed 3. SEVERITY: "How bad is the pain?"  (Scale 1-10; mild, moderate or severe)   - MILD (1-3): doesn't interfere with normal activities    - MODERATE (4-7): interferes with normal activities or awakens from sleep    - SEVERE (8-10): excruciating pain, unable to do any normal activities      moderate 4. URI SYMPTOMS: "Do you have a runny nose or cough?"     no 5. FEVER: "Do you have a fever?" If Yes, ask: "What is your temperature, how was it measured, and when did it start?"     no 6. CAUSE: "Have you been swimming recently?", "How often do you use Q-TIPS?", "Have you had any recent air travel or scuba diving?"     Possible removal of ear tube 7. OTHER SYMPTOMS: "Do you have any other symptoms?" (e.g.,  headache, stiff neck, dizziness, vomiting, runny nose, decreased hearing)     no  Protocols used: Earache-A-AH

## 2024-03-23 DIAGNOSIS — R931 Abnormal findings on diagnostic imaging of heart and coronary circulation: Secondary | ICD-10-CM | POA: Diagnosis not present

## 2024-03-23 DIAGNOSIS — E782 Mixed hyperlipidemia: Secondary | ICD-10-CM | POA: Diagnosis not present

## 2024-03-24 LAB — NMR, LIPOPROFILE
Cholesterol, Total: 307 mg/dL — ABNORMAL HIGH (ref 100–199)
HDL Particle Number: 37.3 umol/L (ref >=30.5)
HDL-C: 108 mg/dL (ref >39)
LDL Particle Number: 2005 nmol/L — ABNORMAL HIGH (ref <1000)
LDL Size: 22.1 nm (ref >20.5)
LDL-C (NIH Calc): 187 mg/dL — ABNORMAL HIGH (ref 0–99)
LP-IR Score: <25 (ref <=45)
Small LDL Particle Number: <90 nmol/L (ref <=527)
Triglycerides: 78 mg/dL (ref 0–149)

## 2024-03-24 LAB — LIPOPROTEIN A (LPA): Lipoprotein (a): 70.5 nmol/L (ref <75.0)

## 2024-03-27 ENCOUNTER — Ambulatory Visit (HOSPITAL_BASED_OUTPATIENT_CLINIC_OR_DEPARTMENT_OTHER): Payer: Medicare Other | Admitting: Internal Medicine

## 2024-03-27 ENCOUNTER — Other Ambulatory Visit (HOSPITAL_BASED_OUTPATIENT_CLINIC_OR_DEPARTMENT_OTHER): Payer: Self-pay

## 2024-03-27 ENCOUNTER — Telehealth (HOSPITAL_BASED_OUTPATIENT_CLINIC_OR_DEPARTMENT_OTHER): Payer: Self-pay

## 2024-03-27 VITALS — BP 124/76 | HR 80 | Ht 68.0 in | Wt 145.8 lb

## 2024-03-27 DIAGNOSIS — T466X5D Adverse effect of antihyperlipidemic and antiarteriosclerotic drugs, subsequent encounter: Secondary | ICD-10-CM

## 2024-03-27 DIAGNOSIS — G720 Drug-induced myopathy: Secondary | ICD-10-CM | POA: Diagnosis not present

## 2024-03-27 DIAGNOSIS — R931 Abnormal findings on diagnostic imaging of heart and coronary circulation: Secondary | ICD-10-CM

## 2024-03-27 DIAGNOSIS — Z789 Other specified health status: Secondary | ICD-10-CM

## 2024-03-27 DIAGNOSIS — E785 Hyperlipidemia, unspecified: Secondary | ICD-10-CM | POA: Diagnosis not present

## 2024-03-27 MED ORDER — NEXLIZET 180-10 MG PO TABS: 1.0000 | ORAL_TABLET | Freq: Every day | ORAL | Status: DC

## 2024-03-27 NOTE — Progress Notes (Signed)
 LIPID CLINIC CONSULT NOTE  Chief Complaint:  Manage dyslipidemia  Primary Care Physician: Tanya Reichmann, MD  Primary Cardiologist:  None  HPI:  Tanya Snyder is a 73 y.o. female who is being seen today for the evaluation of dyslipidemia at the request of Tanya Gess, MD. this is a pleasant 73 year old female kindly referred by Dr. Gery Snyder for evaluation management of dyslipidemia.  She has a history of high cholesterol and was placed on a statin in the past by doctor in [place].  Apparently she had significant muscle aches but it was sometime before she had testing of a CK which was significantly elevated over 3600.  She was then advised to stop the statin.  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  Subsequently she has been tried on ezetimibe and more recently was on Repatha.  She said this apparently had caused her UTI although she had been having recurrent UTI symptoms and was on therapy with antibiotics prophylactically.  Afterwards she was off of both Repatha and ezetimibe and her cholesterol has gone up substantially.  Her LDL particle number was 2005, with LDL 187, HDL 108 and triglycerides 78.  She had an LP(a) which was top normal at 70.5 nmol/L.  She also had coronary calcium scoring which showed a calcium score 35.4, 53rd percentile for age and sex matched controls.  PMHx:  Past Medical History:  Diagnosis Date   Asthma    Breast cancer (HCC)    Cancer (HCC)    Depression    Dysrhythmia    History of chicken pox    Hyperlipemia    Hypothyroidism    Scoliosis     Past Surgical History:  Procedure Laterality Date   ABDOMINAL HYSTERECTOMY     CHOLECYSTECTOMY     COLONOSCOPY WITH PROPOFOL N/A 09/01/2015   Procedure: COLONOSCOPY WITH PROPOFOL;  Surgeon: Tanya Maxwell, MD;  Location: [facility] ENDOSCOPY;  Service: Endoscopy;  Laterality: N/A;   COLONOSCOPY WITH PROPOFOL N/A 03/21/2021   Procedure: COLONOSCOPY WITH PROPOFOL;  Surgeon: Tanya Minium, MD;  Location: [facility]  ENDOSCOPY;  Service: Endoscopy;  Laterality: N/A;   MASTECTOMY     MYRINGOTOMY WITH TUBE PLACEMENT Right 01/24/2022   Procedure: MYRINGOTOMY WITH TUBE PLACEMENT;  Surgeon: Tanya Face, MD;  Location: [facility];  Service: ENT;  Laterality: Right;  pt. request later in morning 8 am   nare polypectomy Left    SPINAL FUSION     UPPER GI ENDOSCOPY      FAMHx:  Family History  Problem Relation Age of Onset   Multiple sclerosis Mother    Prostate cancer Maternal Uncle    Colon cancer Maternal Uncle    Colon cancer Maternal Grandmother     SOCHx:   reports that she has quit smoking. Her smoking use included cigarettes. She has never used smokeless tobacco. She reports that she does not drink alcohol and does not use drugs.  ALLERGIES:  Allergies  Allergen Reactions   Lortab [Hydrocodone-Acetaminophen] Other (See Comments)    Nausea, headache   Statins     CK >3600     ROS: Pertinent items noted in HPI and remainder of comprehensive ROS otherwise negative.  HOME MEDS: Current Outpatient Medications on File Prior to Visit  Medication Sig Dispense Refill   albuterol (VENTOLIN HFA) 108 (90 Base) MCG/ACT inhaler SMARTSIG:1-2 Puff(s) Via Inhaler Every 6 Hours PRN     amphetamine-dextroamphetamine (ADDERALL) 10 MG tablet Take 30 mg by mouth every morning.     ascorbic  acid (VITAMIN C) 1000 MG tablet Take 500 mg by mouth daily.     calcium carbonate (TUMS - DOSED IN MG ELEMENTAL CALCIUM) 500 MG chewable tablet Chew 1 tablet by mouth daily.     clobetasol ointment (TEMOVATE) 0.05 % Apply topically daily.     clonazePAM (KLONOPIN) 0.5 MG tablet Take 0.5 mg by mouth 2 (two) times daily as needed for anxiety.     cyclobenzaprine (FLEXERIL) 5 MG tablet Take 5 mg by mouth at bedtime as needed.     cycloSPORINE (RESTASIS) 0.05 % ophthalmic emulsion Place 1 drop into both eyes 2 (two) times daily. 0.4 mL 2   D-MANNOSE PO Take by mouth.     gabapentin (NEURONTIN) 100 MG capsule Take  1-3 capsules (100-300 mg total) by mouth at bedtime. 90 capsule 0   hydrOXYzine (ATARAX) 25 MG tablet Take 25 mg by mouth daily.     levothyroxine (SYNTHROID) 125 MCG tablet Take 125 mcg by mouth daily.     mupirocin ointment (BACTROBAN) 2 % Apply 1 Application topically 2 (two) times daily. 22 g 0   nitrofurantoin (MACRODANTIN) 50 MG capsule Take 100 mg by mouth daily at 6 PM.     triamcinolone (NASACORT) 55 MCG/ACT AERO nasal inhaler Place into the nose.     triamcinolone cream (KENALOG) 0.1 % Apply 1 Application topically 2 (two) times daily as needed. (Patient taking differently: Apply 1 Application topically as needed.) 45 g 0   WIXELA INHUB 250-50 MCG/DOSE AEPB 1 puff 2 (two) times daily.     zolpidem (AMBIEN) 5 MG tablet Take 5 mg by mouth at bedtime as needed.     amLODipine (NORVASC) 2.5 MG tablet Take 2.5 mg by mouth daily.     No current facility-administered medications on file prior to visit.    LABS/IMAGING: No results found for this or any previous visit (from the past 48 hours). No results found.  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   Tanya Snyder has a dyslipidemia with a high LDL cholesterol.  She was able to tolerate ezetimibe but cannot take Repatha due to issue with UTI.  Statins are absolutely contraindicated due to high CK.  I did note that her CK was  tested normal at 115 back in 2019.  1 option for her may be Nexlizet.  Since she can already tolerate Zetia I would advise restarting that in combination with Nexletol.  We could provide samples for her.  This should not total give her a 50% reduction in cholesterol.  In addition, she may be able to get grant assistance through health well.  I would recommend a CK in about a month to make sure that that is not elevated on therapy  and then we will plan repeat lipids in about 3 to 4 months with follow-up with our prevention APP at that time.  Thanks again for the kind referral.  Chrystie Nose, MD, [facility]  [place]  [facility]  Medical Director of the Advanced Lipid Disorders &  Cardiovascular Risk Reduction Clinic Diplomate of the American Board of Clinical Lipidology Attending Cardiologist  Direct Dial: [phone]  Fax: [phone]  Website:  [email or web address] Nicely Mayfield Schoene 03/27/2024, 3:45 PM

## 2024-03-27 NOTE — Patient Instructions (Signed)
 Medication Instructions:  Your physician has recommended you make the following change in your medication:   -Start nexlizet (Bempedoic acid& ezetimibe) 180mg /10mg  once daily.  **We will get a prior authorization for this medication for you, we will be in touch when we get a response**   The [facility] offers assistance to help pay for medication copays.  They will cover copays for all cholesterol lowering meds, including statins, fibrates, omega-3 fish oils like Vascepa, ezetimibe, Repatha, Praluent, Nexletol, Nexlizet.  The cards are usually good for $2,500 or 12 months, whichever comes first. Our fax # is [phone] (you will need this to apply) Go to healthwellfoundation.org Click on "Apply Now" Answer questions as to whom is applying (patient or representative) Your disease fund will be "hypercholesterolemia - Medicare access" They will ask questions about finances and which medications you are taking for cholesterol When you submit, the approval is usually within minutes.  You will need to print the card information from the site You will need to show this information to your pharmacy, they will bill your Medicare Part D plan first -then bill [employer] Well --for the copay.   You can also call them at [phone], although the hold times can be quite long.     *If you need a refill on your cardiac medications before your next appointment, please call your pharmacy*  Lab Work: Your physician recommends that you return for lab work in: 1 months for CK- not fasting  Your physician recommends that you return for lab work in: 3-4 months FASTING for NMR lipid panel  If you have labs (blood work) drawn today and your tests are completely normal, you will receive your results only by: MyChart Message (if you have MyChart) OR A paper copy in the mail If you have any lab test that is abnormal or we need to change your treatment, we will call you to review the  results.   Follow-Up: At [facility], you and your health needs are our priority.  As part of our continuing mission to provide you with exceptional heart care, our providers are all part of one team.  This team includes your primary Cardiologist (physician) and Advanced Practice Providers or APPs (Physician Assistants and Nurse Practitioners) who all work together to provide you with the care you need, when you need it.  Your next appointment:   3-4 month(s)  Provider:   Eligha Bridegroom, NP    We recommend signing up for the patient portal called "MyChart".  Sign up information is provided on this After Visit Summary.  MyChart is used to connect with patients for Virtual Visits (Telemedicine).  Patients are able to view lab/test results, encounter notes, upcoming appointments, etc.  Non-urgent messages can be sent to your provider as well.   To learn more about what you can do with MyChart, go to [email or web address].

## 2024-03-27 NOTE — Telephone Encounter (Signed)
 Orders for ambulatory referral to Nutrition and Diabetic Education placed per Dr. Rennis Golden.

## 2024-03-27 NOTE — Telephone Encounter (Signed)
 Disregard

## 2024-03-27 NOTE — Addendum Note (Signed)
 Addended by: Bernita Buffy on: 03/27/2024 04:18 PM   Modules accepted: Orders

## 2024-03-29 ENCOUNTER — Encounter (HOSPITAL_BASED_OUTPATIENT_CLINIC_OR_DEPARTMENT_OTHER): Payer: Self-pay | Admitting: Internal Medicine

## 2024-03-30 ENCOUNTER — Telehealth: Payer: Self-pay

## 2024-03-30 ENCOUNTER — Other Ambulatory Visit (HOSPITAL_COMMUNITY): Payer: Self-pay

## 2024-03-30 NOTE — Telephone Encounter (Signed)
 Pharmacy Patient Advocate Encounter   Received notification from Physician's Office that prior authorization for NEXLIZET is required/requested.   Insurance verification completed.   The patient is insured through [facility] .   Per test claim: PA required; PA submitted to above mentioned insurance via CoverMyMeds Key/confirmation #/EOC [id number] Status is pending

## 2024-03-30 NOTE — Telephone Encounter (Signed)
-----   Message from Nurse Blima Singer sent at 03/27/2024  3:47 PM EDT ----- Regarding: Nexlizet PA Hello,  Pt need prior authorization for Nexlizet. She is statin and PCSK9i intolerant with hyperlipidemia and elevated coronary calcium score.  Thank you! Carma Lair

## 2024-03-30 NOTE — Telephone Encounter (Signed)
 Pharmacy Patient Advocate Encounter  Received notification from [facility] that Prior Authorization for NEXLIZET has been APPROVED from 03/30/24 to 09/29/24. Ran test claim, Copay is $422.43 (DEDUCTIBLE). This test claim was processed through [facility]- copay amounts may vary at other pharmacies due to pharmacy/plan contracts, or as the patient moves through the different stages of their insurance plan.

## 2024-03-31 ENCOUNTER — Encounter: Payer: Self-pay | Admitting: Podiatry

## 2024-03-31 ENCOUNTER — Ambulatory Visit: Admitting: Podiatry

## 2024-03-31 DIAGNOSIS — L600 Ingrowing nail: Secondary | ICD-10-CM

## 2024-03-31 DIAGNOSIS — D2371 Other benign neoplasm of skin of right lower limb, including hip: Secondary | ICD-10-CM

## 2024-03-31 DIAGNOSIS — D2372 Other benign neoplasm of skin of left lower limb, including hip: Secondary | ICD-10-CM | POA: Diagnosis not present

## 2024-03-31 MED ORDER — NEOMYCIN-POLYMYXIN-HC 1 % OT SOLN: OTIC | 1 refills | Status: DC

## 2024-03-31 NOTE — Patient Instructions (Signed)

## 2024-03-31 NOTE — Progress Notes (Signed)
 She presents today chief complaint of ingrown toenail tibial border hallux left with a painful callus plantar aspect fourth metatarsal left foot.  Objective: Vital signs are stable alert oriented x 3.  Pulses are palpable.  Sharp incurvated nail margin along the tibial border of the hallux left with mild hallux valgus deformity accentuating her tenderness.  Plantarflexed fourth metatarsal fat pad atrophy resulting in benign skin lesion pain plantar aspect left foot.  No open lesions or wounds.  Assessment: Ingrown toenail painful benign lesion.  Plan: Chemical matricectomy was performed to the tibial border today of the hallux left.  Also debrided reactive hyperkeratotic lesion plantar aspect left foot applied salicylic acid under occlusion to be washed off thoroughly when she start soaking this foot within a day or 2.  I will follow-up with her in a few days at which time we can replace her salicylic acid pad.

## 2024-04-01 ENCOUNTER — Encounter: Payer: Self-pay | Admitting: Podiatry

## 2024-04-01 ENCOUNTER — Telehealth (INDEPENDENT_AMBULATORY_CARE_PROVIDER_SITE_OTHER): Payer: Self-pay | Admitting: Otolaryngology

## 2024-04-01 MED ORDER — NEXLIZET 180-10 MG PO TABS: 1.0000 | ORAL_TABLET | Freq: Every day | ORAL | 11 refills | Status: AC

## 2024-04-01 NOTE — Telephone Encounter (Signed)
 Rx(s) sent to pharmacy electronically.

## 2024-04-01 NOTE — Telephone Encounter (Signed)
 Letter written Read Drivers

## 2024-04-01 NOTE — Addendum Note (Signed)
 Addended by: Lindell Spar on: 04/01/2024 09:39 AM   Modules accepted: Orders

## 2024-04-01 NOTE — Telephone Encounter (Signed)
 Update sent to patient in MyChart

## 2024-04-06 ENCOUNTER — Telehealth: Payer: Self-pay | Admitting: Pharmacy Technician

## 2024-04-06 ENCOUNTER — Encounter: Payer: Self-pay | Admitting: Pharmacy Technician

## 2024-04-06 NOTE — Telephone Encounter (Signed)
   I called [facility] and gave them the information and sent pt a mychart

## 2024-04-14 ENCOUNTER — Ambulatory Visit: Admitting: Podiatry

## 2024-04-14 ENCOUNTER — Encounter: Payer: Self-pay | Admitting: Podiatry

## 2024-04-14 DIAGNOSIS — L600 Ingrowing nail: Secondary | ICD-10-CM

## 2024-04-14 DIAGNOSIS — Z9889 Other specified postprocedural states: Secondary | ICD-10-CM

## 2024-04-15 ENCOUNTER — Telehealth: Payer: Self-pay

## 2024-04-15 NOTE — Progress Notes (Signed)
 She presents today for follow-up of her matrixectomy hallux left.  She denies fever chills nausea vomiting muscle aches and pains that she has not been soaking it lately.  Pulses are palpable there is no erythema edema salines drainage odor scab is present along the margin.  Peers to be healing very nicely.  Assessment: Well-healing surgical toe.  Plan: Follow-up with us  on an as-needed basis.

## 2024-04-15 NOTE — Telephone Encounter (Signed)
 Copied from CRM [id number]. Topic: Clinical - Medical Advice >> Apr 15, 2024 12:12 PM Carlatta H wrote: Reason for CRM: Please call patient at [phone] to advise if lab orders were received from Dr Hilty//

## 2024-04-16 NOTE — Telephone Encounter (Signed)
 Spoke with the patient and advised she contact Dr Arman Lamprey office as she is no longer under the care of Dr Bambi Lever as PCP.

## 2024-04-17 LAB — NMR, LIPOPROFILE
Cholesterol, Total: 191 mg/dL (ref 100–199)
HDL Particle Number: 40.3 umol/L (ref >=30.5)
HDL-C: 93 mg/dL (ref >39)
LDL Particle Number: 908 nmol/L (ref <1000)
LDL Size: 21.2 nm (ref >20.5)
LDL-C (NIH Calc): 87 mg/dL (ref 0–99)
LP-IR Score: <25 (ref <=45)
Small LDL Particle Number: <90 nmol/L (ref <=527)
Triglycerides: 57 mg/dL (ref 0–149)

## 2024-04-17 LAB — CK: Total CK: 119 U/L (ref 32–182)

## 2024-04-22 ENCOUNTER — Other Ambulatory Visit: Payer: Self-pay | Admitting: Family Medicine

## 2024-04-22 DIAGNOSIS — M545 Low back pain, unspecified: Secondary | ICD-10-CM

## 2024-06-03 ENCOUNTER — Encounter (HOSPITAL_BASED_OUTPATIENT_CLINIC_OR_DEPARTMENT_OTHER): Payer: Self-pay

## 2024-06-03 DIAGNOSIS — Z79899 Other long term (current) drug therapy: Secondary | ICD-10-CM

## 2024-06-08 ENCOUNTER — Other Ambulatory Visit: Payer: Self-pay | Admitting: Family Medicine

## 2024-06-08 DIAGNOSIS — M545 Low back pain, unspecified: Secondary | ICD-10-CM

## 2024-06-22 ENCOUNTER — Other Ambulatory Visit: Payer: Self-pay | Admitting: Family Medicine

## 2024-06-22 DIAGNOSIS — M545 Low back pain, unspecified: Secondary | ICD-10-CM

## 2024-06-22 NOTE — Telephone Encounter (Signed)
 Copied from CRM [id number]. Topic: Clinical - Medication Refill >> Jun 22, 2024 10:00 AM Franky GRADE wrote: Medication: gabapentin  (NEURONTIN ) 100 MG capsule [[id number]]  Has the patient contacted their pharmacy? Yes, asked patient to contact primary care provider. (Agent: If no, request that the patient contact the pharmacy for the refill. If patient does not wish to contact the pharmacy document the reason why and proceed with request.) (Agent: If yes, when and what did the pharmacy advise?)  This is the patient's preferred pharmacy:  [id number] [address], [place] - [address] AT [facility] [address] [place] [address] Phone: [phone] Fax: [phone]  Is this the correct pharmacy for this prescription? Yes If no, delete pharmacy and type the correct one.   Has the prescription been filled recently? No  Is the patient out of the medication? No  Has the patient been seen for an appointment in the last year OR does the patient have an upcoming appointment? Yes  Can we respond through MyChart? Yes  Agent: Please be advised that Rx refills may take up to 3 business days. We ask that you follow-up with your pharmacy.

## 2024-06-23 ENCOUNTER — Other Ambulatory Visit: Payer: Self-pay | Admitting: Family Medicine

## 2024-06-23 DIAGNOSIS — M545 Low back pain, unspecified: Secondary | ICD-10-CM

## 2024-07-01 ENCOUNTER — Encounter (HOSPITAL_BASED_OUTPATIENT_CLINIC_OR_DEPARTMENT_OTHER): Payer: Self-pay | Admitting: Internal Medicine

## 2024-07-01 ENCOUNTER — Encounter: Payer: Self-pay | Admitting: Family Medicine

## 2024-07-02 LAB — URIC ACID: Uric Acid: 2.5 mg/dL — ABNORMAL LOW (ref 3.1–7.9)

## 2024-07-03 ENCOUNTER — Ambulatory Visit (HOSPITAL_BASED_OUTPATIENT_CLINIC_OR_DEPARTMENT_OTHER): Payer: Self-pay | Admitting: Internal Medicine

## 2024-07-07 NOTE — Progress Notes (Signed)
 Cardiology Office Note   Date:  07/08/2024  ID:  LUSINE CORLETT, DOB 06-Mar-1951, MRN [id number] PCP: [name], MD  [facility] Providers Cardiologist:  None     PMH Dyslipidemia Statin myopathy Repatha  intolerance Coronary artery calcification CT Calcium score 06/04/23 CAC score 35.4 (53rd percentile) LM 0, LAD 35.4, LCx 0, RCA 0 Aortic atherosclerosis Hypertension Mitral valve disease Breast cancer  Followed by Dr. Court for mitral valve prolapse and chest pain.  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  No family history of heart disease.  She is a breast cancer survivor prior mastectomy.   Seen by Medford Bolk, RPH at which time she was taking ezetimibe 10 mg daily.  While in [place] , she was started on statin but did not recall the name.  Felt poorly for several months and was found to have CK of 3408 and AST of 99.  She has been hesitant to try further statins.  Lipid panel at that time revealed LDL 125, HDL 101.1, triglycerides 83, and total cholesterol 243.  She was started on Repatha .   Seen by Dr. Mona 03/27/2024 and reported Repatha  caused recurrent UTI.  She is on therapy with antibiotics prophylactically.  She stopped both Repatha  and ezetimibe and at time of visit LDL particle number was 2005, LDL 187, HDL 108, and triglycerides 78.  LP(a) was top normal at 70.5 nmol/L.  Coronary calcium score 35.8 (53rd percentile). She was started on Nexlizet  and a total CK was checked a few weeks later which was normal.   NMR completed 04/16/24 revealed LDL particle #908, LDL-C 87, HDL-C 93, triglycerides 57, small LDL-P <90, and total cholesterol 191.  She was concerned about the risk of gout on Nexlizet , however uric acid level on 07/01/2024 was low.   History of Present Illness  Discussed the use of AI scribe software for clinical note transcription with the  patient, who gave verbal consent to proceed.  History of Present Illness SHANDI GODFREY is a very pleasant 73 year old female who presents for follow-up of dyslipidemia. She reports recent increase in hip pain which somewhat impairs her physical activity. She continues to do light cardio and light weight lifting. Occasional chest discomfort described as a 'pinching' sensation she feels is secondary to history of costochondritis, which is relieved by massage and does not worsen with exertion. She denies palpitations, shortness of breath, orthopnea, PND, presyncope or syncope. She is currently on Nexlizet  for cholesterol management, with an LDL level of 87. She previously discontinued Repatha  due to UTIs and Lipitor due to elevated CK levels. She regularly wears compression socks to manage circulation issues related to past chemotherapy treatments. She had past ultrasound (screening) that indicated carotid artery disease.   ROS: See HPI  Studies Reviewed       Lipoprotein (a)  Date/Time Value Ref Range Status  03/23/2024 08:37 AM 70.5 <75.0 nmol/L Final    Comment:    Note:  Values greater than or equal to 75.0 nmol/L may        indicate an independent risk factor for CHD,        but must be evaluated with caution when applied        to non-Caucasian populations due to the        influence of genetic factors on Lp(a) across        ethnicities.  Risk Assessment/Calculations           Physical Exam VS:  BP 118/72 (BP Location: Left Arm, Patient Position: Sitting, Cuff Size: Normal)   Pulse (!) 58   Ht 5' 8 (1.727 m)   Wt 144 lb (65.3 kg)   SpO2 97%   BMI 21.90 kg/m    Wt Readings from Last 3 Encounters:  07/08/24 144 lb (65.3 kg)  03/27/24 145 lb 12.8 oz (66.1 kg)  03/06/24 147 lb 6.4 oz (66.9 kg)    GEN: Well nourished, well developed in no acute distress NECK: No JVD; No carotid bruits CARDIAC: RRR, no murmurs, rubs, gallops RESPIRATORY:  Clear to auscultation  without rales, wheezing or rhonchi  ABDOMEN: Soft, non-tender, non-distended EXTREMITIES:  No edema; No deformity    Assessment & Plan Dyslipidemia LDL goal < 70 Statin intolerance Repatha  intolerance Lipid panel completed 03/23/24 with LDL particle number 2005, LDL-C 187, HDL-C 108, triglycerides 78, total cholesterol 307, small LDL-P < 90. She is tolerating Nexlizet  without concerning side effects. Concern that pain in toes was gout, however uric acid level was low. Statins are contraindicated in the setting of CK elevation.  LDL is 87 mg/dL, with a goal of 70 mg/dL or lower. Encouraged heart healthy diet avoiding saturated fat, processed foods, simple carbohydrates, and sugar. She is active with some limitations based on chronic hip and back pain.  We will recheck NMR in 3 months. Continue Nexlizet .   Carotid artery disease   Prior screening ultrasound that revealed carotid disease. We will get carotid duplex to evaluate for carotid stenosis.   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 Intermittent chest discomfort that occurs most often with rest and does not worsen with exertion and is not associated with shortness of breath, diaphoresis, n/v. Pain is relieved by massage.  Encouraged her to report if chest discomfort worsens or becomes more frequent.  Thoracic aortic dilatation Mitral valve disease Echo 06/06/2023 revealed mild MR, borderline bileaflet mitral valve prolapse, myxomatous MV.  Mild dilatation of ascending aorta measuring 40 mm.  Recommendation by primary cardiologist to repeat echo in 1 year.  She is asymptomatic..  We will schedule echo for evaluation of aortic dilatation and structural heart disease.   Severe scoliosis   Chronic severe scoliosis affects her hip and organs, causing significant hip pain. She is planning to see ortho for evaluation of potential hip replacement.         Dispo: 3 months with Dr. Court (primary  cardiologist)/ Lipid clinic as needed  Signed, Rosaline Bane, NP-C

## 2024-07-08 ENCOUNTER — Encounter (HOSPITAL_BASED_OUTPATIENT_CLINIC_OR_DEPARTMENT_OTHER): Payer: Self-pay | Admitting: *Deleted

## 2024-07-08 ENCOUNTER — Ambulatory Visit (HOSPITAL_BASED_OUTPATIENT_CLINIC_OR_DEPARTMENT_OTHER): Admitting: Nurse Practitioner

## 2024-07-08 ENCOUNTER — Encounter (HOSPITAL_BASED_OUTPATIENT_CLINIC_OR_DEPARTMENT_OTHER): Payer: Self-pay | Admitting: Nurse Practitioner

## 2024-07-08 VITALS — BP 118/72 | HR 58 | Ht 68.0 in | Wt 144.0 lb

## 2024-07-08 DIAGNOSIS — G720 Drug-induced myopathy: Secondary | ICD-10-CM

## 2024-07-08 DIAGNOSIS — I7121 Aneurysm of the ascending aorta, without rupture: Secondary | ICD-10-CM

## 2024-07-08 DIAGNOSIS — Z789 Other specified health status: Secondary | ICD-10-CM

## 2024-07-08 DIAGNOSIS — E785 Hyperlipidemia, unspecified: Secondary | ICD-10-CM

## 2024-07-08 DIAGNOSIS — R931 Abnormal findings on diagnostic imaging of heart and coronary circulation: Secondary | ICD-10-CM | POA: Diagnosis not present

## 2024-07-08 DIAGNOSIS — E782 Mixed hyperlipidemia: Secondary | ICD-10-CM

## 2024-07-08 DIAGNOSIS — M940 Chondrocostal junction syndrome [Tietze]: Secondary | ICD-10-CM

## 2024-07-08 DIAGNOSIS — R0789 Other chest pain: Secondary | ICD-10-CM

## 2024-07-08 DIAGNOSIS — I779 Disorder of arteries and arterioles, unspecified: Secondary | ICD-10-CM

## 2024-07-08 DIAGNOSIS — I341 Nonrheumatic mitral (valve) prolapse: Secondary | ICD-10-CM

## 2024-07-08 DIAGNOSIS — T466X5D Adverse effect of antihyperlipidemic and antiarteriosclerotic drugs, subsequent encounter: Secondary | ICD-10-CM

## 2024-07-08 NOTE — Patient Instructions (Signed)
 Medication Instructions:   Your physician recommends that you continue on your current medications as directed. Please refer to the Current Medication list given to you today.   *If you need a refill on your cardiac medications before your next appointment, please call your pharmacy*  Lab Work:  Your physician recommends that you return for a FASTING lipid profile 1 week prior to Dr. Court appointment. Fasting after midnight. At any [facility].    If you have labs (blood work) drawn today and your tests are completely normal, you will receive your results only by: MyChart Message (if you have MyChart) OR A paper copy in the mail If you have any lab test that is abnormal or we need to change your treatment, we will call you to review the results.  Testing/Procedures:  Your physician has requested that you have a carotid duplex. This test is an ultrasound of the carotid arteries in your neck. It looks at blood flow through these arteries that supply the brain with blood. Allow one hour for this exam. There are no restrictions or special instructions.  Your physician has requested that you have an echocardiogram. Echocardiography is a painless test that uses sound waves to create images of your heart. It provides your doctor with information about the size and shape of your heart and how well your heart's chambers and valves are working. This procedure takes approximately one hour. There are no restrictions for this procedure. Please do NOT wear cologne, perfume or lotions (deodorant is allowed). Please arrive 15 minutes prior to your appointment time.  . Follow-Up: At [facility], you and your health needs are our priority.  As part of our continuing mission to provide you with exceptional heart care, our providers are all part of one team.  This team includes your primary Cardiologist (physician) and Advanced Practice Providers or APPs (Physician Assistants and Nurse Practitioners)  who all work together to provide you with the care you need, when you need it.  Your next appointment:   3 month(s)  Provider:   Dorn Court, MD   We recommend signing up for the patient portal called MyChart.  Sign up information is provided on this After Visit Summary.  MyChart is used to connect with patients for Virtual Visits (Telemedicine).  Patients are able to view lab/test results, encounter notes, upcoming appointments, etc.  Non-urgent messages can be sent to your provider as well.   To learn more about what you can do with MyChart, go to [email or web address].

## 2024-07-09 ENCOUNTER — Encounter: Payer: Self-pay | Admitting: Podiatry

## 2024-07-09 ENCOUNTER — Ambulatory Visit: Admitting: Podiatry

## 2024-07-09 DIAGNOSIS — D2371 Other benign neoplasm of skin of right lower limb, including hip: Secondary | ICD-10-CM | POA: Diagnosis not present

## 2024-07-09 DIAGNOSIS — D2372 Other benign neoplasm of skin of left lower limb, including hip: Secondary | ICD-10-CM | POA: Diagnosis not present

## 2024-07-12 NOTE — Progress Notes (Signed)
 She presents today concerned about calluses plantar aspect of bilateral foot.  Objective: Vital signs stable oriented x 3 multiple benign skin lesions plantar aspect of the bilateral foot there is no erythema edema salines drainage or odor no signs of infection.  Lesions are palpated.  Assessment: Benign skin lesions bilateral.  Plan: Debridement of benign skin lesions bilateral.

## 2024-07-17 ENCOUNTER — Encounter (HOSPITAL_BASED_OUTPATIENT_CLINIC_OR_DEPARTMENT_OTHER): Payer: Self-pay

## 2024-07-23 ENCOUNTER — Other Ambulatory Visit (HOSPITAL_BASED_OUTPATIENT_CLINIC_OR_DEPARTMENT_OTHER): Payer: Self-pay | Admitting: Nurse Practitioner

## 2024-07-23 ENCOUNTER — Ambulatory Visit (HOSPITAL_COMMUNITY)
Admission: RE | Admit: 2024-07-23 | Discharge: 2024-07-23 | Disposition: A | Discharge status: Home / Self Care | Source: Ambulatory Visit | Attending: Vascular Surgery | Admitting: Vascular Surgery

## 2024-07-23 DIAGNOSIS — E785 Hyperlipidemia, unspecified: Secondary | ICD-10-CM

## 2024-07-23 DIAGNOSIS — Z789 Other specified health status: Secondary | ICD-10-CM | POA: Diagnosis present

## 2024-07-23 DIAGNOSIS — M940 Chondrocostal junction syndrome [Tietze]: Secondary | ICD-10-CM

## 2024-07-23 DIAGNOSIS — E782 Mixed hyperlipidemia: Secondary | ICD-10-CM

## 2024-07-23 DIAGNOSIS — R931 Abnormal findings on diagnostic imaging of heart and coronary circulation: Secondary | ICD-10-CM | POA: Diagnosis present

## 2024-07-23 DIAGNOSIS — I341 Nonrheumatic mitral (valve) prolapse: Secondary | ICD-10-CM

## 2024-07-23 DIAGNOSIS — I779 Disorder of arteries and arterioles, unspecified: Secondary | ICD-10-CM

## 2024-07-23 DIAGNOSIS — I7121 Aneurysm of the ascending aorta, without rupture: Secondary | ICD-10-CM | POA: Diagnosis present

## 2024-07-23 DIAGNOSIS — G720 Drug-induced myopathy: Secondary | ICD-10-CM

## 2024-07-23 DIAGNOSIS — T466X5A Adverse effect of antihyperlipidemic and antiarteriosclerotic drugs, initial encounter: Secondary | ICD-10-CM | POA: Diagnosis present

## 2024-07-23 DIAGNOSIS — R0789 Other chest pain: Secondary | ICD-10-CM

## 2024-07-27 ENCOUNTER — Ambulatory Visit: Payer: Self-pay | Admitting: Nurse Practitioner

## 2024-07-28 ENCOUNTER — Ambulatory Visit (HOSPITAL_BASED_OUTPATIENT_CLINIC_OR_DEPARTMENT_OTHER): Admitting: Certified Nurse Midwife

## 2024-07-28 ENCOUNTER — Encounter (HOSPITAL_BASED_OUTPATIENT_CLINIC_OR_DEPARTMENT_OTHER): Payer: Self-pay | Admitting: Certified Nurse Midwife

## 2024-07-28 VITALS — BP 135/84 | HR 64 | Ht 68.5 in | Wt 140.2 lb

## 2024-07-28 DIAGNOSIS — N904 Leukoplakia of vulva: Secondary | ICD-10-CM | POA: Diagnosis not present

## 2024-07-28 DIAGNOSIS — L299 Pruritus, unspecified: Secondary | ICD-10-CM

## 2024-07-28 DIAGNOSIS — Z124 Encounter for screening for malignant neoplasm of cervix: Secondary | ICD-10-CM

## 2024-07-28 DIAGNOSIS — Z853 Personal history of malignant neoplasm of breast: Secondary | ICD-10-CM

## 2024-07-28 DIAGNOSIS — L900 Lichen sclerosus et atrophicus: Secondary | ICD-10-CM | POA: Insufficient documentation

## 2024-07-28 MED ORDER — CLOBETASOL PROPIONATE 0.05 % EX OINT: TOPICAL_OINTMENT | Freq: Two times a day (BID) | CUTANEOUS | 3 refills | Status: AC

## 2024-07-28 MED ORDER — NITROFURANTOIN MONOHYD MACRO 100 MG PO CAPS: 100.0000 mg | ORAL_CAPSULE | Freq: Every day | ORAL | 3 refills | Status: DC

## 2024-07-28 MED ORDER — TRIAMCINOLONE ACETONIDE 0.1 % EX CREA: 1.0000 | TOPICAL_CREAM | Freq: Two times a day (BID) | CUTANEOUS | 5 refills | Status: AC | PRN

## 2024-07-28 NOTE — Progress Notes (Signed)
 73 y.o. G67P2002 Divorced White or Caucasian female here to establish care. Her last documented annual gyn exam was 10/2023.Pt was previously diagnosed with Lichen Sclerosus (vulva). She uses Clobetasol  ointment twice daily when a flare occurs. Does not use Clobetasol  for maintenance. Uses Triamcinolone  as needed (low-potency)  Pt states she takes Macrobid  100mg  daily to prevent recurrent UTI. Desires refill.  Hx Double Mastectomy. Internal Medicine MD checks CA 125 annually. Pt has had visit with Dermatology/skin check.   No LMP recorded. Patient has had a hysterectomy.          Sexually active: No.   Exercising: Yes.     Smoker:  no  Health Maintenance: Pap:  Pap not indicated per ASCCP guidelines History of abnormal Pap:  no MMG:  Hx Breast Cancer/Double Mastectomy Colonoscopy:  03/21/2021 BMD:   04/01/2023 Screening Labs: Primary  Care   reports that she has quit smoking. Her smoking use included cigarettes. She has never used smokeless tobacco. She reports that she does not drink alcohol and does not use drugs.  Past Medical History:  Diagnosis Date   Asthma    Breast cancer (HCC)    Cancer (HCC)    Depression    Dysrhythmia    History of chicken pox    Hyperlipemia    Hypothyroidism    Scoliosis     Past Surgical History:  Procedure Laterality Date   ABDOMINAL HYSTERECTOMY     CHOLECYSTECTOMY     COLONOSCOPY WITH PROPOFOL  N/A 09/01/2015   Procedure: COLONOSCOPY WITH PROPOFOL ;  Surgeon: Donnice Vaughn Manes, MD;  Location: [facility] ENDOSCOPY;  Service: Endoscopy;  Laterality: N/A;   COLONOSCOPY WITH PROPOFOL  N/A 03/21/2021   Procedure: COLONOSCOPY WITH PROPOFOL ;  Surgeon: [name], MD;  Location: [facility] ENDOSCOPY;  Service: Endoscopy;  Laterality: N/A;   MASTECTOMY     MYRINGOTOMY WITH TUBE PLACEMENT Right 01/24/2022   Procedure: MYRINGOTOMY WITH TUBE PLACEMENT;  Surgeon: [name], MD;  Location: [facility];  Service: ENT;  Laterality: Right;  pt. request  later in morning 8 am   nare polypectomy Left    SPINAL FUSION     UPPER GI ENDOSCOPY      Current Outpatient Medications  Medication Sig Dispense Refill   albuterol (VENTOLIN HFA) 108 (90 Base) MCG/ACT inhaler SMARTSIG:1-2 Puff(s) Via Inhaler Every 6 Hours PRN     amLODipine (NORVASC) 2.5 MG tablet Take 2.5 mg by mouth daily.     amphetamine-dextroamphetamine (ADDERALL) 10 MG tablet Take 30 mg by mouth every morning.     ascorbic acid (VITAMIN C) 1000 MG tablet Take 500 mg by mouth daily.     Bempedoic Acid-Ezetimibe (NEXLIZET ) 180-10 MG TABS Take 1 tablet by mouth daily. 30 tablet 11   clobetasol  ointment (TEMOVATE ) 0.05 % Apply topically daily.     clonazePAM (KLONOPIN) 0.5 MG tablet Take 0.5 mg by mouth 2 (two) times daily as needed for anxiety.     cyclobenzaprine (FLEXERIL) 5 MG tablet Take 5 mg by mouth at bedtime as needed.     cycloSPORINE  (RESTASIS ) 0.05 % ophthalmic emulsion Place 1 drop into both eyes 2 (two) times daily. 0.4 mL 2   D-MANNOSE PO Take by mouth.     gabapentin  (NEURONTIN ) 100 MG capsule TAKE 1 TO 3 CAPSULES(100 TO 300 MG) BY MOUTH AT BEDTIME 90 capsule 0   hydrOXYzine (ATARAX) 25 MG tablet Take 25 mg by mouth daily.     levothyroxine (SYNTHROID) 125 MCG tablet Take 125 mcg by mouth daily.  NEOMYCIN -POLYMYXIN-HYDROCORTISONE (CORTISPORIN) 1 % SOLN OTIC solution Apply 1-2 drops to toe BID after soaking 10 mL 1   nitrofurantoin , macrocrystal-monohydrate, (MACROBID ) 100 MG capsule Take 100 mg by mouth daily.     triamcinolone  (NASACORT ) 55 MCG/ACT AERO nasal inhaler Place into the nose.     triamcinolone  cream (KENALOG ) 0.1 % Apply 1 Application topically 2 (two) times daily as needed. 45 g 0   WIXELA INHUB 250-50 MCG/DOSE AEPB 1 puff 2 (two) times daily.     zolpidem (AMBIEN) 5 MG tablet Take 5 mg by mouth at bedtime as needed.     calcium carbonate (TUMS - DOSED IN MG ELEMENTAL CALCIUM) 500 MG chewable tablet Chew 1 tablet by mouth daily. (Patient not taking:  Reported on 07/28/2024)     mupirocin  ointment (BACTROBAN ) 2 % Apply 1 Application topically 2 (two) times daily. (Patient not taking: Reported on 07/28/2024) 22 g 0   No current facility-administered medications for this visit.    Family History  Problem Relation Age of Onset   Multiple sclerosis Mother    Prostate cancer Maternal Uncle    Colon cancer Maternal Uncle    Colon cancer Maternal Grandmother     ROS: Constitutional: negative Genitourinary:negative  Exam:   BP 135/84   Pulse 64   Ht 5' 8.5 (1.74 m)   Wt 140 lb 3.2 oz (63.6 kg)   BMI 21.01 kg/m   Height: 5' 8.5 (174 cm)  General appearance: alert, cooperative and appears stated age  Pelvic: External genitalia:  no lesions              Urethra:  normal appearing urethra with no masses, tenderness or lesions              Bartholins and Skenes: normal                 Vagina: external vulvar lichen sclerosus present              Cervix: absent              Pap taken: No. Bimanual Exam:  Uterus:  uterus absent              Adnexa: deferred per pt request (vaginal atrophy makes it painful)              Anus:  normal sphincter tone, no lesions, large ext hemorrhoid  Chaperone,  CMA, was present for exam.  Assessment/Plan: Lichen Sclerosus - triamcinolone  cream (KENALOG ) 0.1 %; Apply 1 Application topically 2 (two) times daily as needed.  Dispense: 45 g; Refill: 5  - If pt experiences flare, she will apply Clobetasol  ointment fingertip amount twice a day for 2 weeks.  RTO in 1 year for annual gyn exam and prn if issues arise. Arland MARLA Roller

## 2024-07-30 ENCOUNTER — Other Ambulatory Visit: Payer: Self-pay

## 2024-07-30 ENCOUNTER — Ambulatory Visit: Admitting: Allergy & Immunology

## 2024-07-30 ENCOUNTER — Encounter: Payer: Self-pay | Admitting: Allergy & Immunology

## 2024-07-30 VITALS — BP 116/68 | HR 61 | Temp 98.1°F | Resp 18 | Ht 67.5 in | Wt 142.0 lb

## 2024-07-30 DIAGNOSIS — J310 Chronic rhinitis: Secondary | ICD-10-CM | POA: Diagnosis not present

## 2024-07-30 DIAGNOSIS — J4530 Mild persistent asthma, uncomplicated: Secondary | ICD-10-CM | POA: Diagnosis not present

## 2024-07-30 NOTE — Patient Instructions (Addendum)
 1. Mild persistent asthma, uncomplicated - Lung testing looked terrible, but this is likely because of your scoliosis.  - I am not going to make any changes since your symptomatically seem to be stable.  - Spacer use reviewed. - Daily controller medication(s): Advair 230/86mcg two puffs ONCE daily with spacer - Prior to physical activity: albuterol 2 puffs 10-15 minutes before physical activity. - Rescue medications: albuterol 4 puffs every 4-6 hours as needed - Changes during respiratory infections or worsening symptoms: Increase Advair to 2 puffs twice daily for TWO WEEKS. - Asthma control goals:  * Full participation in all desired activities (may need albuterol before activity) * Albuterol use two time or less a week on average (not counting use with activity) * Cough interfering with sleep two time or less a month * Oral steroids no more than once a year * No hospitalizations  2. Chronic rhinitis - Because of insurance stipulations, we cannot do skin testing on the same day as your first visit. - We are all working to fight this, but for now we need to do two separate visits.  - We will know more after we do testing at the next visit.  - The skin testing visit can be squeezed in at your convenience.  - Then we can make a more full plan to address all of your symptoms. - Be sure to stop your antihistamines for 3 days before this appointment.  - This might help figure out your triggers.   3. Return in about 1 week (around 08/06/2024) for ALLERGY TESTING (1-55). You can have the follow up appointment with Dr. Iva or a Nurse Practicioner (our Nurse Practitioners are excellent and always have Physician oversight!).    Please inform us  of any Emergency Department visits, hospitalizations, or changes in symptoms. Call us  before going to the ED for breathing or allergy symptoms since we might be able to fit you in for a sick visit. Feel free to contact us  anytime with any questions,  problems, or concerns.  It was a pleasure to meet you today!  Websites that have reliable patient information: 1. American Academy of Asthma, Allergy, and Immunology: www.aaaai.org 2. Food Allergy Research and Education (FARE): foodallergy.org 3. Mothers of Asthmatics: http://www.asthmacommunitynetwork.org 4. American College of Allergy, Asthma, and Immunology: www.acaai.org      "Like" us  on Facebook and Instagram for our latest updates!      A healthy democracy works best when [employer] participate! Make sure you are registered to vote! If you have moved or changed any of your contact information, you will need to get this updated before voting! Scan the QR codes below to learn more!

## 2024-07-30 NOTE — Progress Notes (Signed)
 NEW PATIENTate of Service/Encounter:  07/30/24  Consult requested by: [name], MD   Assessment:   Mild persistent asthma, uncomplicated  Restrictive pattern on spirometry, likely due to history of severe scoliosis.  Chronic rhinitis - planning for skin testing in the future  Plan/Recommendations:   1. Mild persistent asthma, uncomplicated - Lung testing looked terrible, but this is likely because of your scoliosis.  - I am not going to make any changes since your symptomatically seem to be stable.  - Spacer use reviewed. - Daily controller medication(s): Advair 230/55mcg two puffs ONCE daily with spacer - Prior to physical activity: albuterol 2 puffs 10-15 minutes before physical activity. - Rescue medications: albuterol 4 puffs every 4-6 hours as needed - Changes during respiratory infections or worsening symptoms: Increase Advair to 2 puffs twice daily for TWO WEEKS. - Asthma control goals:  * Full participation in all desired activities (may need albuterol before activity) * Albuterol use two time or less a week on average (not counting use with activity) * Cough interfering with sleep two time or less a month * Oral steroids no more than once a year * No hospitalizations  2. Chronic rhinitis - Because of insurance stipulations, we cannot do skin testing on the same day as your first visit. - We are all working to fight this, but for now we need to do two separate visits.  - We will know more after we do testing at the next visit.  - The skin testing visit can be squeezed in at your convenience.  - Then we can make a more full plan to address all of your symptoms. - Be sure to stop your antihistamines for 3 days before this appointment.  - This might help figure out your triggers.   3. Return in about 1 week (around 08/06/2024) for ALLERGY TESTING (1-55). You can have the follow up appointment with Dr. Iva or a Nurse Practicioner (our Nurse Practitioners  are excellent and always have Physician oversight!).   This note in its entirety was forwarded to the Provider who requested this consultation.  Subjective:   Tanya Snyder is a 73 y.o. female presenting today for evaluation of  Chief Complaint  Patient presents with   Establish Care   Allergic Rhinitis     Allergies doing okay. Constant itching.    Asthma    No flare ups. 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   Agatston CAC score, <100 06/12/2023   Atypical chest pain 04/30/2023   Cataracts, bilateral 03/29/2023   Dry eye 03/29/2023   Prediabetes 08/14/2022   Pain in joint of right hip 06/08/2021   Numbness in feet 06/05/2021   Imbalance 06/05/2021   Dizziness 06/05/2021   Hx of colonic polyps    Polyp of descending colon    Neuropathy 02/14/2021   Mild persistent asthma, uncomplicated 02/07/2021   Allergic rhinitis 02/07/2021   Palpitations 10/25/2020   Asthma without status asthmaticus 07/05/2020   Depression 07/05/2020   Hyperlipidemia 07/05/2020   Mitral valve prolapse 06/05/2019   Status post right mastectomy 03/18/2019   Bilateral renal masses 08/30/2018   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   History of recurrent UTIs 12/21/2016   Chronic bilateral low back pain without sciatica 08/16/2016   Enthesopathy of right hip 08/07/2016   Mid back pain, chronic 04/29/2015   Personal history of breast cancer 04/29/2015   Malignant neoplasm of right female breast (HCC) 01/07/2015   Lower urinary tract infectious disease 05/24/2014   Pain in thoracic spine 04/14/2012   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  Discussed the use of AI scribe software for clinical note transcription with the patient and/or guardian, who gave verbal consent to proceed.  Tanya Snyder was referred by [name], MD.     Tanya Snyder is a 73 y.o. female presenting for an evaluation of asthma and allergies.  Asthma/Respiratory Symptom History: She has been on Wixela. She had a spiro in 2013 that was 48% FEV1. She was changed to an Advair HFA 230/21 two puffs twice daily. She now only takes one puff once daily now. She feels that her breathing is under good control. She has a history of asthma and uses albuterol and Advair HFA 230/21, although she typically only takes one puff in the morning instead of the prescribed two puffs twice a day. Lung function tests showed 48% in 2013 and 51% today. She believes her scoliosis affects her breathing.  She has scoliosis which might be affecting her breathing as well. She has scoliosis, for which she underwent surgery in 1965, including rib resections. She experiences back pain and engages in hip exercises to manage her symptoms.  She did bring in her old records from her allergy and asthma clinic in [place] .  At the time that she was seeing them, she was on Prilosec, Synthroid, albuterol, Advair, and Naphcon-A.  She had a consistent restrictive pattern on her spirometry.  She was on immunotherapy when she lived there for 8 years, with good results.  It looks like her Advair dosing went back and forth, but she was always on a controller medication.  Allergic Rhinitis Symptom History: She has allergies, particularly to dogs, identified after acquiring a Yorkie Poo. Allergy testing and shots in [place]  were  effective. She no longer has a dog and does not plan to get another due to her allergies. She has ear issues, having had a tube placed in her right ear in March 2023 due to shooting pains. The tube has since fallen out, and she manages her ear health by popping her ears in the shower.   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 She uses mupirocin  prophylactically in her nostrils to prevent infections, applying a dime-sized amount twice a week, which has been effective.  She did have an immune workup that was fairly reassuring with normal immunoglobulins, IgG subclasses, and streptococcal titers.  She had excellent tetanus titers as well.  Her FEV1 even back then was 48% predicted.  She had allergy testing last performed in June 2013 that was positive to trees, grasses, weeds, dust mites, cat, dog, but otherwise negative.  She has a history of breast cancer diagnosed in 1998, treated with surgery, chemotherapy, and radiation. 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 There has been no recurrence of breast  cancer.  Otherwise, there is no history of other atopic diseases, including drug allergies, stinging insect allergies, or contact dermatitis. There is no significant infectious history. Vaccinations are up to date.    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   Agatston CAC score, <100 06/12/2023   Atypical chest pain 04/30/2023   Cataracts, bilateral 03/29/2023   Dry eye 03/29/2023   Prediabetes 08/14/2022   Pain in joint of right hip 06/08/2021   Numbness in feet 06/05/2021   Imbalance  06/05/2021   Dizziness 06/05/2021   Hx of colonic polyps    Polyp of descending colon    Neuropathy 02/14/2021   Mild persistent asthma, uncomplicated 02/07/2021   Allergic rhinitis 02/07/2021   Palpitations 10/25/2020   Asthma without status asthmaticus 07/05/2020   Depression 07/05/2020   Hyperlipidemia 07/05/2020   Mitral valve prolapse 06/05/2019   Status post right mastectomy 03/18/2019   Bilateral renal masses 08/30/2018   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   History of recurrent UTIs 12/21/2016   Chronic bilateral low back pain without sciatica 08/16/2016   Enthesopathy of right hip 08/07/2016   Mid back pain, chronic 04/29/2015   Personal history of breast cancer 04/29/2015   Malignant neoplasm of right female breast (HCC) 01/07/2015   Lower urinary tract infectious disease 05/24/2014   Pain in thoracic spine 04/14/2012   Idiopathic scoliosis and kyphoscoliosis 04/14/2012   Pain in joint, pelvic region and thigh 12/13/2005   Other specified acquired hypothyroidism 12/04/2004   Acute sinusitis, unspecified 12/04/2004   Hypoglycemia, unspecified 10/23/2004    Medication List:  Allergies as of 07/30/2024       Reactions   Lortab [hydrocodone-acetaminophen ] Other (See Comments)   Nausea, headache   Statins    CK >3600         Medication List        Accurate as of July 30, 2024  1:43 PM. If you have any questions, ask your nurse or doctor.          albuterol 108 (90 Base) MCG/ACT inhaler Commonly known as: VENTOLIN HFA SMARTSIG:1-2 Puff(s) Via Inhaler Every 6 Hours PRN   amLODipine 2.5 MG tablet Commonly known as: NORVASC Take 2.5 mg by mouth daily.   amphetamine-dextroamphetamine 10 MG tablet Commonly known as: ADDERALL Take 30 mg by mouth every morning.   ascorbic acid 1000 MG tablet Commonly known as: VITAMIN C Take 500 mg by mouth daily.   calcium carbonate 500 MG chewable tablet Commonly known as: TUMS - dosed  in mg elemental calcium Chew 1 tablet by mouth daily.   clobetasol  ointment 0.05 % Commonly known as: TEMOVATE  Apply topically 2 (two) times daily. Apply twice a day when Lichen Sclerosus flares up   clonazePAM 0.5 MG tablet Commonly known as: KLONOPIN Take 0.5 mg by mouth 2 (two) times daily as needed for anxiety.   cyclobenzaprine 5 MG tablet Commonly known as: FLEXERIL Take 5 mg by mouth at bedtime as needed.   cycloSPORINE  0.05 % ophthalmic emulsion Commonly known as: Restasis  Place 1 drop into both eyes 2 (two) times daily.   D-MANNOSE PO Take by mouth.   fluticasone-salmeterol 230-21 MCG/ACT inhaler Commonly known as: ADVAIR HFA Inhale  2 puffs into the lungs 2 (two) times daily. What changed: Another medication with the same name was removed. Continue taking this medication, and follow the directions you see here. Changed by: Marty Morton Shaggy   gabapentin  100 MG capsule Commonly known as: NEURONTIN  TAKE 1 TO 3 CAPSULES(100 TO 300 MG) BY MOUTH AT BEDTIME What changed: See the new instructions.   hydrOXYzine 25 MG tablet Commonly known as: ATARAX Take 25 mg by mouth daily.   levothyroxine 125 MCG tablet Commonly known as: SYNTHROID Take 125 mcg by mouth daily.   mupirocin  ointment 2 % Commonly known as: BACTROBAN  Apply 1 Application topically 2 (two) times daily.   NEOMYCIN -POLYMYXIN-HYDROCORTISONE 1 % Soln OTIC solution Commonly known as: CORTISPORIN Apply 1-2 drops to toe BID after soaking   Nexlizet  180-10 MG Tabs Generic drug: Bempedoic Acid-Ezetimibe Take 1 tablet by mouth daily.   nitrofurantoin  (macrocrystal-monohydrate) 100 MG capsule Commonly known as: MACROBID  Take 1 capsule (100 mg total) by mouth daily.   triamcinolone  55 MCG/ACT Aero nasal inhaler Commonly known as: NASACORT  Place into the nose.   triamcinolone  cream 0.1 % Commonly known as: KENALOG  Apply 1 Application topically 2 (two) times daily as needed.   zolpidem 5 MG  tablet Commonly known as: AMBIEN Take 5 mg by mouth at bedtime as needed.        Birth History: non-contributory  Developmental History: non-contributory  Past Surgical History: Past Surgical History:  Procedure Laterality Date   ABDOMINAL HYSTERECTOMY     CHOLECYSTECTOMY     COLONOSCOPY WITH PROPOFOL  N/A 09/01/2015   Procedure: COLONOSCOPY WITH PROPOFOL ;  Surgeon: Donnice Vaughn Manes, MD;  Location: [facility] ENDOSCOPY;  Service: Endoscopy;  Laterality: N/A;   COLONOSCOPY WITH PROPOFOL  N/A 03/21/2021   Procedure: COLONOSCOPY WITH PROPOFOL ;  Surgeon: [name], MD;  Location: [facility] ENDOSCOPY;  Service: Endoscopy;  Laterality: N/A;   MASTECTOMY     MYRINGOTOMY WITH TUBE PLACEMENT Right 01/24/2022   Procedure: MYRINGOTOMY WITH TUBE PLACEMENT;  Surgeon: [name], MD;  Location: [facility];  Service: ENT;  Laterality: Right;  pt. request later in morning 8 am   nare polypectomy Left    SPINAL FUSION     UPPER GI ENDOSCOPY       Family History: Family History  Problem Relation Age of Onset   Multiple sclerosis Mother    Prostate cancer Maternal Uncle    Colon cancer Maternal Uncle    Colon cancer Maternal Grandmother      Social History: Tanya Snyder lives at home with her family. [place]. She no longer has a dog. She is a retired Runner, broadcasting/film/video. She has two children - one lives in [place] and another lives in [place]. She has three grand-children whom she describes as brats.    Review of systems otherwise negative other than that mentioned in the HPI.    Objective:   Blood pressure 116/68, pulse 61, temperature 98.1 F (36.7 C), temperature source Temporal, resp. rate 18, height 5' 7.5 (1.715 m), weight 142 lb (64.4 kg), SpO2 97%. Body mass index is 21.91 kg/m.     Physical Exam Vitals reviewed.  Constitutional:      Appearance: She is well-developed.     Comments: Super talkative.   HENT:     Head: Normocephalic and  atraumatic.     Right Ear: Tympanic membrane, ear canal and external ear normal. No drainage, swelling or tenderness. Tympanic membrane is not injected, scarred, erythematous, retracted or bulging.  Left Ear: Tympanic membrane, ear canal and external ear normal. No drainage, swelling or tenderness. Tympanic membrane is not injected, scarred, erythematous, retracted or bulging.     Nose: No nasal deformity, septal deviation, mucosal edema or rhinorrhea.     Right Turbinates: Enlarged, swollen and pale.     Left Turbinates: Enlarged, swollen and pale.     Right Sinus: No maxillary sinus tenderness or frontal sinus tenderness.     Left Sinus: No maxillary sinus tenderness or frontal sinus tenderness.     Comments: No polyps.     Mouth/Throat:     Mouth: Mucous membranes are not pale and not dry.     Pharynx: Uvula midline.  Eyes:     General:        Right eye: No discharge.        Left eye: No discharge.     Conjunctiva/sclera: Conjunctivae normal.     Right eye: Right conjunctiva is not injected. No chemosis.    Left eye: Left conjunctiva is not injected. No chemosis.    Pupils: Pupils are equal, round, and reactive to light.  Cardiovascular:     Rate and Rhythm: Normal rate and regular rhythm.     Heart sounds: Normal heart sounds.  Pulmonary:     Effort: Pulmonary effort is normal. No tachypnea, accessory muscle usage or respiratory distress.     Breath sounds: Normal breath sounds. No wheezing, rhonchi or rales.  Chest:     Chest wall: No tenderness.  Abdominal:     Tenderness: There is no abdominal tenderness. There is no guarding or rebound.  Lymphadenopathy:     Head:     Right side of head: No submandibular, tonsillar or occipital adenopathy.     Left side of head: No submandibular, tonsillar or occipital adenopathy.     Cervical: No cervical adenopathy.  Skin:    Coloration: Skin is not pale.     Findings: No abrasion, erythema, petechiae or rash. Rash is not papular,  urticarial or vesicular.  Neurological:     Mental Status: She is alert.  Psychiatric:        Behavior: Behavior is cooperative.      Diagnostic studies:    Spirometry: results abnormal (FEV1: 1.21/52%, FVC: 2.48/81%, FEV1/FVC: 49%).    Spirometry consistent with moderate obstructive disease. Xopenex four puffs via MDI treatment given in clinic with no improvement.  Allergy Studies: none         Marty Shaggy, MD [facility] 

## 2024-07-31 ENCOUNTER — Encounter: Payer: Self-pay | Admitting: Gastroenterology

## 2024-08-10 ENCOUNTER — Encounter (HOSPITAL_BASED_OUTPATIENT_CLINIC_OR_DEPARTMENT_OTHER)

## 2024-08-12 ENCOUNTER — Other Ambulatory Visit (HOSPITAL_BASED_OUTPATIENT_CLINIC_OR_DEPARTMENT_OTHER): Payer: Self-pay

## 2024-08-12 ENCOUNTER — Ambulatory Visit (HOSPITAL_BASED_OUTPATIENT_CLINIC_OR_DEPARTMENT_OTHER): Admitting: Orthopaedic Surgery

## 2024-08-12 ENCOUNTER — Ambulatory Visit (INDEPENDENT_AMBULATORY_CARE_PROVIDER_SITE_OTHER)

## 2024-08-12 DIAGNOSIS — M4186 Other forms of scoliosis, lumbar region: Secondary | ICD-10-CM | POA: Diagnosis not present

## 2024-08-12 DIAGNOSIS — M25551 Pain in right hip: Secondary | ICD-10-CM

## 2024-08-12 DIAGNOSIS — M1611 Unilateral primary osteoarthritis, right hip: Secondary | ICD-10-CM | POA: Diagnosis not present

## 2024-08-12 MED ORDER — TIZANIDINE HCL 4 MG PO TABS
4.0000 mg | ORAL_TABLET | Freq: Four times a day (QID) | ORAL | 2 refills | Status: AC | PRN
  Filled 2024-08-12: qty 30, 8d supply, fill #0
  Filled 2024-11-30: qty 30, 8d supply, fill #1

## 2024-08-12 NOTE — Progress Notes (Signed)
 Chief Complaint: Right hip pain     History of Present Illness:    Tanya Snyder is a 73 y.o. female presents today for follow-up of her right hip.  She is here today for an additional assessment.  She has been seen by Dr. Starr at [facility].  She does have a history of scoliosis surgery done nearly 30 years prior for which she is doing well.  Since this time she is having increasing pain about the right hip.  She does take tizanidine  which helps her significantly.  She has had a history of injections in the right hip which gave her relief.  She has had physical therapy as well which does help her symptomatically    PMH/PSH/Family History/Social History/Meds/Allergies:    Past Medical History:  Diagnosis Date   Asthma    Breast cancer (HCC)    Cancer (HCC)    Depression    Dysrhythmia    History of chicken pox    Hyperlipemia    Hypothyroidism    Scoliosis    Past Surgical History:  Procedure Laterality Date   ABDOMINAL HYSTERECTOMY     CHOLECYSTECTOMY     COLONOSCOPY WITH PROPOFOL  N/A 09/01/2015   Procedure: COLONOSCOPY WITH PROPOFOL ;  Surgeon: Donnice Vaughn Manes, MD;  Location: [facility] ENDOSCOPY;  Service: Endoscopy;  Laterality: N/A;   COLONOSCOPY WITH PROPOFOL  N/A 03/21/2021   Procedure: COLONOSCOPY WITH PROPOFOL ;  Surgeon: [name], MD;  Location: [facility] ENDOSCOPY;  Service: Endoscopy;  Laterality: N/A;   MASTECTOMY     MYRINGOTOMY WITH TUBE PLACEMENT Right 01/24/2022   Procedure: MYRINGOTOMY WITH TUBE PLACEMENT;  Surgeon: [name], MD;  Location: [facility];  Service: ENT;  Laterality: Right;  pt. request later in morning 8 am   nare polypectomy Left    SPINAL FUSION     UPPER GI ENDOSCOPY     Social History   Socioeconomic History   Marital status: Divorced    Spouse name: Not on file   Number of children: Not on file   Years of education: Not on file   Highest education level: Not on file  Occupational History   Not on file  Tobacco  Use   Smoking status: Former    Types: Cigarettes   Smokeless tobacco: Never  Vaping Use   Vaping status: Never Used  Substance and Sexual Activity   Alcohol use: No    Alcohol/week: 0.0 standard drinks of alcohol   Drug use: Never   Sexual activity: Not Currently  Other Topics Concern   Not on file  Social History Narrative   Not on file   Social Drivers of Health   Financial Resource Strain: Low Risk  (11/08/2023)   Received from [facility]   Overall Financial Resource Strain (CARDIA)    Difficulty of Paying Living Expenses: Not hard at all  Food Insecurity: No Food Insecurity (11/08/2023)   Received from [facility]   Hunger Vital Sign    Within the past 12 months, you worried that your food would run out before you got the money to buy more.: Never true    Within the past 12 months, the food you bought just didn't last and you didn't have money to get more.: Never true  Transportation Needs: No Transportation Needs (11/08/2023)   Received from [facility]    In the past 12 months, has lack of transportation kept you from medical appointments  or from getting medications?: No    Lack of Transportation (Non-Medical): No  Physical Activity: Sufficiently Active (07/31/2023)   Exercise Vital Sign    Days of Exercise per Week: 3 days    Minutes of Exercise per Session: 60 min  Stress: Stress Concern Present (07/31/2023)   [employer] of Occupational Health - Occupational Stress Questionnaire    Feeling of Stress : To some extent  Social Connections: Moderately Integrated (07/31/2023)   Social Connection and Isolation Panel    Frequency of Communication with Friends and Family: More than three times a week    Frequency of Social Gatherings with Friends and Family: More than three times a week    Attends Religious Services: More than 4 times per year    Active Member of [employer] or Organizations: Yes     Attends Engineer, structural: More than 4 times per year    Marital Status: Divorced   Family History  Problem Relation Age of Onset   Multiple sclerosis Mother    Prostate cancer Maternal Uncle    Colon cancer Maternal Uncle    Colon cancer Maternal Grandmother    Allergies  Allergen Reactions   Lortab [Hydrocodone-Acetaminophen ] Other (See Comments)    Nausea, headache   Statins     CK >3600    Current Outpatient Medications  Medication Sig Dispense Refill   tiZANidine  (ZANAFLEX ) 4 MG tablet Take 1 tablet (4 mg total) by mouth every 6 (six) hours as needed for muscle spasms. 30 tablet 2   albuterol (VENTOLIN HFA) 108 (90 Base) MCG/ACT inhaler SMARTSIG:1-2 Puff(s) Via Inhaler Every 6 Hours PRN     amLODipine (NORVASC) 2.5 MG tablet Take 2.5 mg by mouth daily.     amphetamine-dextroamphetamine (ADDERALL) 10 MG tablet Take 30 mg by mouth every morning.     ascorbic acid (VITAMIN C) 1000 MG tablet Take 500 mg by mouth daily.     Bempedoic Acid-Ezetimibe (NEXLIZET ) 180-10 MG TABS Take 1 tablet by mouth daily. 30 tablet 11   calcium carbonate (TUMS - DOSED IN MG ELEMENTAL CALCIUM) 500 MG chewable tablet Chew 1 tablet by mouth daily. (Patient not taking: Reported on 07/30/2024)     clobetasol  ointment (TEMOVATE ) 0.05 % Apply topically 2 (two) times daily. 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     cyclobenzaprine (FLEXERIL) 5 MG tablet Take 5 mg by mouth at bedtime as needed.     cycloSPORINE  (RESTASIS ) 0.05 % ophthalmic emulsion Place 1 drop into both eyes 2 (two) times daily. 0.4 mL 2   D-MANNOSE PO Take by mouth.     fluticasone-salmeterol (ADVAIR HFA) 230-21 MCG/ACT inhaler Inhale 2 puffs into the lungs 2 (two) times daily.     gabapentin  (NEURONTIN ) 100 MG capsule TAKE 1 TO 3 CAPSULES(100 TO 300 MG) BY MOUTH AT BEDTIME (Patient taking differently: as  needed.) 90 capsule 0   hydrOXYzine (ATARAX) 25 MG tablet Take 25 mg by mouth daily.     levothyroxine (SYNTHROID) 125 MCG tablet Take 125 mcg by mouth daily.     mupirocin  ointment (BACTROBAN ) 2 % Apply 1 Application topically 2 (two) times daily. (Patient not taking: Reported on 07/30/2024) 22 g 0   NEOMYCIN -POLYMYXIN-HYDROCORTISONE (CORTISPORIN) 1 % SOLN OTIC solution Apply 1-2 drops to toe BID after soaking 10 mL 1   nitrofurantoin , macrocrystal-monohydrate, (MACROBID ) 100 MG  capsule Take 1 capsule (100 mg total) by mouth daily. 90 capsule 3   triamcinolone  (NASACORT ) 55 MCG/ACT AERO nasal inhaler Place into the nose.     triamcinolone  cream (KENALOG ) 0.1 % Apply 1 Application topically 2 (two) times daily as needed. 45 g 5   zolpidem (AMBIEN) 5 MG tablet Take 5 mg by mouth at bedtime as needed.     No current facility-administered medications for this visit.   No results found.  Review of Systems:   A ROS was performed including pertinent positives and negatives as documented in the HPI.  Physical Exam :   Constitutional: NAD and appears stated age Neurological: Alert and oriented Psych: Appropriate affect and cooperative There were no vitals taken for this visit.   Comprehensive Musculoskeletal Exam:    Right hip with tenderness in the FADIR position although this is mild.  No crepitus or pain with mid range of motion.  Distal neurosensory exam is intact   Imaging:   Xray (4 views right hip): Coxa profunda with mild osteoarthritic findings     I personally reviewed and interpreted the radiographs.   Assessment and Plan:   73 y.o. 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  She is overall quite active and is still participating in physical therapy.  She is still having symptoms on the right hip and as well as her shoulders and is hoping to get really a good guided exercise program that will assist her in exercises that she can  do without progressing her arthritis or pain.  Will plan for physical therapy referral for this.  I have also offered a right hip injection which she would like to proceed with in September prior to her upcoming trip.  Finally I do believe she may ultimately be a candidate for right hip arthroplasty although we will plan for that referral at the time of our next injection  -Return to clinic for right hip injection   I personally saw and evaluated the patient, and participated in the management and treatment plan.  Elspeth Parker, MD Attending Physician, Orthopedic Surgery  This document was dictated using Dragon voice recognition software. A reasonable attempt at proof reading has been made to minimize errors.

## 2024-08-17 ENCOUNTER — Ambulatory Visit: Payer: Self-pay | Admitting: Nurse Practitioner

## 2024-08-17 ENCOUNTER — Ambulatory Visit (HOSPITAL_COMMUNITY)
Admission: RE | Admit: 2024-08-17 | Discharge: 2024-08-17 | Disposition: A | Discharge status: Home / Self Care | Source: Ambulatory Visit | Attending: Cardiology | Admitting: Cardiology

## 2024-08-17 ENCOUNTER — Telehealth: Payer: Self-pay | Admitting: Cardiology

## 2024-08-17 DIAGNOSIS — I083 Combined rheumatic disorders of mitral, aortic and tricuspid valves: Secondary | ICD-10-CM

## 2024-08-17 DIAGNOSIS — T466X5A Adverse effect of antihyperlipidemic and antiarteriosclerotic drugs, initial encounter: Secondary | ICD-10-CM | POA: Diagnosis not present

## 2024-08-17 DIAGNOSIS — E785 Hyperlipidemia, unspecified: Secondary | ICD-10-CM

## 2024-08-17 DIAGNOSIS — R931 Abnormal findings on diagnostic imaging of heart and coronary circulation: Secondary | ICD-10-CM | POA: Diagnosis not present

## 2024-08-17 DIAGNOSIS — E782 Mixed hyperlipidemia: Secondary | ICD-10-CM | POA: Diagnosis not present

## 2024-08-17 DIAGNOSIS — Z789 Other specified health status: Secondary | ICD-10-CM

## 2024-08-17 DIAGNOSIS — G720 Drug-induced myopathy: Secondary | ICD-10-CM | POA: Insufficient documentation

## 2024-08-17 DIAGNOSIS — I77810 Thoracic aortic ectasia: Secondary | ICD-10-CM | POA: Diagnosis not present

## 2024-08-17 DIAGNOSIS — I7121 Aneurysm of the ascending aorta, without rupture: Secondary | ICD-10-CM | POA: Insufficient documentation

## 2024-08-17 LAB — ECHOCARDIOGRAM COMPLETE
Area-P 1/2: 2.9 cm2
P 1/2 time: 293 ms
S' Lateral: 2.8 cm

## 2024-08-25 ENCOUNTER — Other Ambulatory Visit (HOSPITAL_BASED_OUTPATIENT_CLINIC_OR_DEPARTMENT_OTHER): Payer: Self-pay | Admitting: *Deleted

## 2024-08-25 ENCOUNTER — Encounter (HOSPITAL_BASED_OUTPATIENT_CLINIC_OR_DEPARTMENT_OTHER): Payer: Self-pay | Admitting: Certified Nurse Midwife

## 2024-08-25 DIAGNOSIS — E785 Hyperlipidemia, unspecified: Secondary | ICD-10-CM

## 2024-08-25 DIAGNOSIS — G720 Drug-induced myopathy: Secondary | ICD-10-CM

## 2024-08-25 DIAGNOSIS — Z789 Other specified health status: Secondary | ICD-10-CM

## 2024-08-25 NOTE — Addendum Note (Signed)
 Addended by: FREDIRICK BEAU B on: 08/25/2024 02:46 PM   Modules accepted: Orders

## 2024-08-31 ENCOUNTER — Telehealth: Payer: Self-pay | Admitting: Pharmacy Technician

## 2024-08-31 ENCOUNTER — Other Ambulatory Visit (HOSPITAL_BASED_OUTPATIENT_CLINIC_OR_DEPARTMENT_OTHER): Payer: Self-pay

## 2024-08-31 ENCOUNTER — Other Ambulatory Visit (HOSPITAL_BASED_OUTPATIENT_CLINIC_OR_DEPARTMENT_OTHER): Payer: Self-pay | Admitting: Certified Nurse Midwife

## 2024-08-31 ENCOUNTER — Ambulatory Visit (HOSPITAL_BASED_OUTPATIENT_CLINIC_OR_DEPARTMENT_OTHER): Admitting: Orthopaedic Surgery

## 2024-08-31 DIAGNOSIS — M25551 Pain in right hip: Secondary | ICD-10-CM

## 2024-08-31 DIAGNOSIS — M1611 Unilateral primary osteoarthritis, right hip: Secondary | ICD-10-CM

## 2024-08-31 MED ORDER — NITROFURANTOIN MONOHYD MACRO 100 MG PO CAPS
100.0000 mg | ORAL_CAPSULE | Freq: Every day | ORAL | 3 refills | Status: AC
  Filled 2024-08-31: qty 90, 90d supply, fill #0
  Filled 2024-11-28: qty 90, 90d supply, fill #1

## 2024-08-31 MED ORDER — TRIAMCINOLONE ACETONIDE 40 MG/ML IJ SUSP
80.0000 mg | INTRAMUSCULAR | Status: AC | PRN
  Administered 2024-08-31: 80 mg via INTRA_ARTICULAR

## 2024-08-31 MED ORDER — LIDOCAINE HCL 1 % IJ SOLN
4.0000 mL | INTRAMUSCULAR | Status: AC | PRN
  Administered 2024-08-31: 4 mL

## 2024-08-31 NOTE — Telephone Encounter (Signed)
 Pharmacy Patient Advocate Encounter  Received notification from [employer] that Prior Authorization for nexlizet  has been APPROVED from 08/31/24 to 12/23/24   PA #/Case ID/Reference #: [id number]

## 2024-08-31 NOTE — Telephone Encounter (Signed)
   Pharmacy Patient Advocate Encounter   Received notification from CoverMyMeds that prior authorization for nexlizet  is required/requested.   Insurance verification completed.   The patient is insured through [facility] .   Per test claim: PA required; PA submitted to above mentioned insurance via Latent Key/confirmation #/EOC [id number] Status is pending

## 2024-08-31 NOTE — Progress Notes (Signed)
 Chief Complaint: Right hip pain     History of Present Illness:   08/31/2024: Presents today for follow-up of the right hip.  She is seeking an injection in the right hip.  Tanya Snyder is a 73 y.o. female presents today for follow-up of her right hip.  She is here today for an additional assessment.  She has been seen by Dr. Starr at [facility].  She does have a history of scoliosis surgery done nearly 30 years prior for which she is doing well.  Since this time she is having increasing pain about the right hip.  She does take tizanidine  which helps her significantly.  She has had a history of injections in the right hip which gave her relief.  She has had physical therapy as well which does help her symptomatically    PMH/PSH/Family History/Social History/Meds/Allergies:    Past Medical History:  Diagnosis Date  . Asthma   . Breast cancer (HCC)   . Cancer (HCC)   . Depression   . Dysrhythmia   . History of chicken pox   . Hyperlipemia   . Hypothyroidism   . Scoliosis    Past Surgical History:  Procedure Laterality Date  . ABDOMINAL HYSTERECTOMY    . CHOLECYSTECTOMY    . COLONOSCOPY WITH PROPOFOL  N/A 09/01/2015   Procedure: COLONOSCOPY WITH PROPOFOL ;  Surgeon: Donnice Vaughn Manes, MD;  Location: [facility] ENDOSCOPY;  Service: Endoscopy;  Laterality: N/A;  . COLONOSCOPY WITH PROPOFOL  N/A 03/21/2021   Procedure: COLONOSCOPY WITH PROPOFOL ;  Surgeon: [name], MD;  Location: [facility] ENDOSCOPY;  Service: Endoscopy;  Laterality: N/A;  . MASTECTOMY    . MYRINGOTOMY WITH TUBE PLACEMENT Right 01/24/2022   Procedure: MYRINGOTOMY WITH TUBE PLACEMENT;  Surgeon: [name], MD;  Location: [facility];  Service: ENT;  Laterality: Right;  pt. request later in morning 8 am  . nare polypectomy Left   . SPINAL FUSION    . UPPER GI ENDOSCOPY     Social History   Socioeconomic History  . Marital status: Divorced    Spouse name: Not on file  . Number of children: Not on file   . Years of education: Not on file  . Highest education level: Not on file  Occupational History  . Not on file  Tobacco Use  . Smoking status: Former    Types: Cigarettes  . Smokeless tobacco: Never  Vaping Use  . Vaping status: Never Used  Substance and Sexual Activity  . Alcohol use: No    Alcohol/week: 0.0 standard drinks of alcohol  . Drug use: Never  . Sexual activity: Not Currently  Other Topics Concern  . Not on file  Social History Narrative  . Not on file   Social Drivers of Health   Financial Resource Strain: Low Risk  (11/08/2023)   Received from [facility]   Overall Financial Resource Strain (CARDIA)   . Difficulty of Paying Living Expenses: Not hard at all  Food Insecurity: No Food Insecurity (11/08/2023)   Received from [facility]   Hunger Vital Sign   . Within the past 12 months, you worried that your food would run out before you got the money to buy more.: Never true   . Within the past 12 months, the food you bought just didn't last and you didn't have money to get more.: Never true  Transportation Needs: No Transportation Needs (11/08/2023)   Received from [facility]   PRAPARE -  Transportation   . In the past 12 months, has lack of transportation kept you from medical appointments or from getting medications?: No   . Lack of Transportation (Non-Medical): No  Physical Activity: Sufficiently Active (07/31/2023)   Exercise Vital Sign   . Days of Exercise per Week: 3 days   . Minutes of Exercise per Session: 60 min  Stress: Stress Concern Present (07/31/2023)   [employer] of Occupational Health - Occupational Stress Questionnaire   . Feeling of Stress : To some extent  Social Connections: Moderately Integrated (07/31/2023)   Social Connection and Isolation Panel   . Frequency of Communication with Friends and Family: More than three times a week   . Frequency of Social Gatherings with Friends and  Family: More than three times a week   . Attends Religious Services: More than 4 times per year   . Active Member of Clubs or Organizations: Yes   . Attends Banker Meetings: More than 4 times per year   . Marital Status: Divorced   Family History  Problem Relation Age of Onset  . Multiple sclerosis Mother   . Prostate cancer Maternal Uncle   . Colon cancer Maternal Uncle   . Colon cancer Maternal Grandmother    Allergies  Allergen Reactions  . Lortab [Hydrocodone-Acetaminophen ] Other (See Comments)    Nausea, headache  . Statins     CK >3600    Current Outpatient Medications  Medication Sig Dispense Refill  . albuterol (VENTOLIN HFA) 108 (90 Base) MCG/ACT inhaler SMARTSIG:1-2 Puff(s) Via Inhaler Every 6 Hours PRN    . amLODipine (NORVASC) 2.5 MG tablet Take 2.5 mg by mouth daily.    SABRA amphetamine-dextroamphetamine (ADDERALL) 10 MG tablet Take 30 mg by mouth every morning.    SABRA ascorbic acid (VITAMIN C) 1000 MG tablet Take 500 mg by mouth daily.    . Bempedoic Acid-Ezetimibe (NEXLIZET ) 180-10 MG TABS Take 1 tablet by mouth daily. 30 tablet 11  . calcium carbonate (TUMS - DOSED IN MG ELEMENTAL CALCIUM) 500 MG chewable tablet Chew 1 tablet by mouth daily. (Patient not taking: Reported on 07/30/2024)    . clobetasol  ointment (TEMOVATE ) 0.05 % Apply topically 2 (two) times daily. Apply twice a day when Lichen Sclerosus flares up 60 g 3  . clonazePAM (KLONOPIN) 0.5 MG tablet Take 0.5 mg by mouth 2 (two) times daily as needed for anxiety.    . cyclobenzaprine (FLEXERIL) 5 MG tablet Take 5 mg by mouth at bedtime as needed.    . cycloSPORINE  (RESTASIS ) 0.05 % ophthalmic emulsion Place 1 drop into both eyes 2 (two) times daily. 0.4 mL 2  . D-MANNOSE PO Take by mouth.    . fluticasone-salmeterol (ADVAIR HFA) 230-21 MCG/ACT inhaler Inhale 2 puffs into the lungs 2 (two) times daily.    . gabapentin  (NEURONTIN ) 100 MG capsule TAKE 1 TO 3 CAPSULES(100 TO 300 MG) BY MOUTH AT BEDTIME  (Patient taking differently: as needed.) 90 capsule 0  . hydrOXYzine (ATARAX) 25 MG tablet Take 25 mg by mouth daily.    SABRA levothyroxine (SYNTHROID) 125 MCG tablet Take 125 mcg by mouth daily.    . mupirocin  ointment (BACTROBAN ) 2 % Apply 1 Application topically 2 (two) times daily. (Patient not taking: Reported on 07/30/2024) 22 g 0  . NEOMYCIN -POLYMYXIN-HYDROCORTISONE (CORTISPORIN) 1 % SOLN OTIC solution Apply 1-2 drops to toe BID after soaking 10 mL 1  . nitrofurantoin , macrocrystal-monohydrate, (MACROBID ) 100 MG capsule Take 1 capsule (100 mg total) by mouth  daily. 90 capsule 3  . tiZANidine  (ZANAFLEX ) 4 MG tablet Take 1 tablet (4 mg total) by mouth every 6 (six) hours as needed for muscle spasms. 30 tablet 2  . triamcinolone  (NASACORT ) 55 MCG/ACT AERO nasal inhaler Place into the nose.    . triamcinolone  cream (KENALOG ) 0.1 % Apply 1 Application topically 2 (two) times daily as needed. 45 g 5  . zolpidem (AMBIEN) 5 MG tablet Take 5 mg by mouth at bedtime as needed.     No current facility-administered medications for this visit.   No results found.  Review of Systems:   A ROS was performed including pertinent positives and negatives as documented in the HPI.  Physical Exam :   Constitutional: NAD and appears stated age Neurological: Alert and oriented Psych: Appropriate affect and cooperative There were no vitals taken for this visit.   Comprehensive Musculoskeletal Exam:    Right hip with tenderness in the FADIR position although this is mild.  No crepitus or pain with mid range of motion.  Distal neurosensory exam is intact   Imaging:   Xray (4 views right hip): Coxa profunda with mild osteoarthritic findings     I personally reviewed and interpreted the radiographs.   Assessment and Plan:   73 y.o. 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  She is overall quite active and is still participating in physical therapy.  She  is still having symptoms on the right hip and as well as her shoulders and is hoping to get really a good guided exercise program that will assist her in exercises that she can do without progressing her arthritis or pain.  Will plan for physical therapy referral for this.  I have also offered a right hip injection which she would like to proceed with in September prior to her upcoming trip.  Finally I do believe she may ultimately be a candidate for right hip arthroplasty although we will plan for that referral at the time of our next injection  - Right hip injection provided after verbal consent obtained    Procedure Note  Patient: Tanya Snyder             Date of Birth: 1951-08-28           MRN: [id number]             Visit Date: 08/31/2024  Procedures: Visit Diagnoses:  1. Pain of right hip     Large Joint Inj: R hip joint on 08/31/2024 11:02 AM Indications: pain Details: 22 G 3.5 in needle, ultrasound-guided anterolateral approach  Arthrogram: No  Medications: 4 mL lidocaine  1 %; 80 mg triamcinolone  acetonide 40 MG/ML Outcome: tolerated well, no immediate complications Procedure, treatment alternatives, risks and benefits explained, specific risks discussed. Consent was given by the patient. Immediately prior to procedure a time out was called to verify the correct patient, procedure, equipment, support staff and site/side marked as required. Patient was prepped and draped in the usual sterile fashion.         I personally saw and evaluated the patient, and participated in the management and treatment plan.  Elspeth Parker, MD Attending Physician, Orthopedic Surgery  This document was dictated using Dragon voice recognition software. A reasonable attempt at proof reading has been made to minimize errors.

## 2024-09-02 ENCOUNTER — Encounter (HOSPITAL_BASED_OUTPATIENT_CLINIC_OR_DEPARTMENT_OTHER): Payer: Self-pay | Admitting: Orthopaedic Surgery

## 2024-09-02 ENCOUNTER — Encounter: Payer: Self-pay | Admitting: Family Medicine

## 2024-09-02 NOTE — Telephone Encounter (Signed)
 Called pt and scheduled for 10/26/24

## 2024-09-03 NOTE — Telephone Encounter (Signed)
 error

## 2024-09-04 ENCOUNTER — Other Ambulatory Visit: Payer: Self-pay

## 2024-09-04 ENCOUNTER — Other Ambulatory Visit (HOSPITAL_BASED_OUTPATIENT_CLINIC_OR_DEPARTMENT_OTHER): Payer: Self-pay

## 2024-09-04 ENCOUNTER — Encounter (HOSPITAL_BASED_OUTPATIENT_CLINIC_OR_DEPARTMENT_OTHER): Payer: Self-pay | Admitting: Physical Therapy

## 2024-09-04 ENCOUNTER — Ambulatory Visit (HOSPITAL_BASED_OUTPATIENT_CLINIC_OR_DEPARTMENT_OTHER): Attending: Orthopaedic Surgery | Admitting: Physical Therapy

## 2024-09-04 DIAGNOSIS — G8929 Other chronic pain: Secondary | ICD-10-CM | POA: Diagnosis not present

## 2024-09-04 DIAGNOSIS — M25551 Pain in right hip: Secondary | ICD-10-CM | POA: Diagnosis not present

## 2024-09-04 DIAGNOSIS — M25511 Pain in right shoulder: Secondary | ICD-10-CM | POA: Insufficient documentation

## 2024-09-04 DIAGNOSIS — M6281 Muscle weakness (generalized): Secondary | ICD-10-CM | POA: Insufficient documentation

## 2024-09-04 DIAGNOSIS — R29898 Other symptoms and signs involving the musculoskeletal system: Secondary | ICD-10-CM | POA: Diagnosis not present

## 2024-09-04 MED ORDER — FLUZONE HIGH-DOSE 0.5 ML IM SUSY
0.5000 mL | PREFILLED_SYRINGE | Freq: Once | INTRAMUSCULAR | 0 refills | Status: AC
  Filled 2024-09-04: qty 0.5, 1d supply, fill #0

## 2024-09-04 NOTE — Therapy (Signed)
 OUTPATIENT PHYSICAL THERAPY LOWER EXTREMITY EVALUATION   Patient Name: Tanya Snyder MRN: [id number] DOB:Dec 13, 1951, 73 y.o., female Today's Date: 09/04/2024  END OF SESSION:  PT End of Session - 09/04/24 1023     Visit Number 1    Number of Visits 17    Date for PT Re-Evaluation 11/13/24    Authorization Type [employer] MCR    Authorization Time Period 09/04/24 to 11/13/24    Progress Note Due on Visit 10    PT Start Time 1019    PT Stop Time 1057    PT Time Calculation (min) 38 min    Activity Tolerance Patient tolerated treatment well    Behavior During Therapy WFL for tasks assessed/performed          Past Medical History:  Diagnosis Date   Asthma    Breast cancer (HCC)    Cancer (HCC)    Depression    Dysrhythmia    History of chicken pox    Hyperlipemia    Hypothyroidism    Scoliosis    Past Surgical History:  Procedure Laterality Date   ABDOMINAL HYSTERECTOMY     CHOLECYSTECTOMY     COLONOSCOPY WITH PROPOFOL  N/A 09/01/2015   Procedure: COLONOSCOPY WITH PROPOFOL ;  Surgeon: Donnice Vaughn Manes, MD;  Location: [facility] ENDOSCOPY;  Service: Endoscopy;  Laterality: N/A;   COLONOSCOPY WITH PROPOFOL  N/A 03/21/2021   Procedure: COLONOSCOPY WITH PROPOFOL ;  Surgeon: [name], MD;  Location: [facility] ENDOSCOPY;  Service: Endoscopy;  Laterality: N/A;   MASTECTOMY     MYRINGOTOMY WITH TUBE PLACEMENT Right 01/24/2022   Procedure: MYRINGOTOMY WITH TUBE PLACEMENT;  Surgeon: [name], MD;  Location: [facility];  Service: ENT;  Laterality: Right;  pt. request later in morning 8 am   nare polypectomy Left    SPINAL FUSION     UPPER GI ENDOSCOPY     Patient Active Problem List   Diagnosis Date Noted   HX: breast cancer (Double Mastectomy) 07/28/2024   Lichen sclerosus 07/28/2024   Allergic rhinitis due to animal (cat) (dog) hair and dander 08/22/2023   Allergic rhinitis due to pollen 08/22/2023   Generalized pruritus 08/22/2023   Statin myopathy 07/12/2023    Agatston CAC score, <100 06/12/2023   Atypical chest pain 04/30/2023   Cataracts, bilateral 03/29/2023   Dry eye 03/29/2023   Prediabetes 08/14/2022   Pain in joint of right hip 06/08/2021   Numbness in feet 06/05/2021   Imbalance 06/05/2021   Dizziness 06/05/2021   Hx of colonic polyps    Polyp of descending colon    Neuropathy 02/14/2021   Mild persistent asthma, uncomplicated 02/07/2021   Allergic rhinitis 02/07/2021   Palpitations 10/25/2020   Asthma without status asthmaticus 07/05/2020   Depression 07/05/2020   Hyperlipidemia 07/05/2020   Mitral valve prolapse 06/05/2019   Status post right mastectomy 03/18/2019   Bilateral renal masses 08/30/2018   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   History of recurrent UTIs 12/21/2016   Chronic bilateral low back pain without sciatica 08/16/2016   Enthesopathy of right hip 08/07/2016   Mid back pain, chronic 04/29/2015   Personal history of breast cancer 04/29/2015   Malignant neoplasm of right female breast (HCC) 01/07/2015   Lower urinary tract infectious disease 05/24/2014   Pain in thoracic spine 04/14/2012   Idiopathic scoliosis and kyphoscoliosis 04/14/2012   Pain in joint, pelvic region and thigh 12/13/2005   Other specified acquired hypothyroidism 12/04/2004   Acute sinusitis, unspecified 12/04/2004   Hypoglycemia,  unspecified 10/23/2004    PCP: [name] MD   REFERRING PROVIDER: Genelle Standing, MD  REFERRING DIAG: Diagnosis M25.551 (ICD-10-CM) - Pain of right hip (and B shoulder pain free-texted into order in EPIC)  THERAPY DIAG:  Chronic right shoulder pain  Pain in right hip  Muscle weakness (generalized)  Other symptoms and signs involving the musculoskeletal system  Rationale for Evaluation and Treatment: Rehabilitation  ONSET DATE: chronic   SUBJECTIVE:   SUBJECTIVE STATEMENT:  I'm extremely frustrated from this week, ended up getting a blood draw in [place]  where they took 4-5 vials of blood without MD order. I want you to personally tell me about this, thinking about going to small claims court, would like your personal opinion. R shoulder and R hip are the biggest issues, I do have scolisis. R shoulder is burning and has been getting worse with time and its the same idea with the hip. I've tried some PT and one really aggravated my sx and Dr. Donnie who took out some exercises. Other PT didn't impress me. I've been exercising in the pool on my own. Pool is closed now, can you pencil me into water PT? One thing I was told when I was 73 years old was to put a lift in my left shoe but I always thought it should be in my right shoe so I didn't wear it for a long time. Put the lift in my right shoe but it didn't give relief so I put in my left shoe again. Got a lidocane shot from Dr. Genelle Monday, its been helpful but I'm very sure that I have a hip replacement in my future, if I take the wrong step I feel like my hip will disjoint.   PERTINENT HISTORY: See above, additionally from MD notes:  73 y.o. 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  She is overall quite active and is still participating in physical therapy.  She is still having symptoms on the right hip and as well as her shoulders and is hoping to get really a good guided exercise program that will assist her in exercises that she can do without progressing her arthritis or pain.  Will plan for physical therapy referral for this.  I have also offered a right hip injection which she would like to proceed with in September prior to her upcoming trip.  Finally I do believe she may ultimately be a candidate for right hip arthroplasty although we will plan for that referral at the time of our next injection   PAIN:  Are you having pain? No number given on NPRS at eval  PRECAUTIONS: None  RED FLAGS: None   WEIGHT BEARING RESTRICTIONS: No  FALLS:   Has patient fallen in last 6 months? No  LIVING ENVIRONMENT: Lives with: lives alone   OCCUPATION: retired   PLOF: Independent, Independent with basic ADLs, Independent with gait, and Independent with transfers  PATIENT GOALS: address hip and shoulder pain, find specific exercises that she can keep up with   NEXT MD VISIT: not scheduled with referring   OBJECTIVE:  Note: Objective measures were completed at Evaluation unless otherwise noted.    PATIENT SURVEYS:  PSFS: THE PATIENT SPECIFIC FUNCTIONAL SCALE  Place score of 0-10 (0 = unable to perform activity and 10 = able to perform activity at the same level as before injury or problem)  Activity Date: 09/04/24    Sitting too long 30  minutes(hip) 2    2. Getting out of car normally (hip) 0    3.  Putting dishes  (shoulder) 1    4. Heavy lifting (shoulder) 8    Total Score 2.75     Note: numerical ratings inferred by PT based off pt answers- no formal numbers given   Total Score = Sum of activity scores/number of activities  Minimally Detectable Change: 3 points (for single activity); 2 points (for average score)  [facility] (nd). The Patient Specific Functional Scale . Retrieved from [email or web address]   COGNITION: Overall cognitive status: Within functional limits for tasks assessed         LOWER EXTREMITY MMT:  MMT Right eval Left eval  Hip flexion 3+ 3+  Hip extension    Hip abduction 4 4+  Hip adduction    Hip internal rotation    Hip external rotation    Knee flexion 4 5  Knee extension 5 5  Ankle dorsiflexion    Ankle plantarflexion    Ankle inversion    Ankle eversion     (Blank rows = not tested)   Shoulder MMT:   Flexion 4+/5 B ABD 4/5 B  IR 4+/5 ER 4+/5    FUNCTIONAL TESTS:  5 times sit to stand: 14.7 seconds no UEs              TUG 9.3 seconds                                                                                                                                               TREATMENT DATE:   09/04/24  Eval, POC     PATIENT EDUCATION:  Education details: exam findings, POC, water PT and potential benefits, PT to focus on functional strengthening especially in context of possible THA coming up  Person educated: Patient Education method: Medical illustrator Education comprehension: verbalized understanding and returned demonstration  HOME EXERCISE PROGRAM: TBD   ASSESSMENT:  CLINICAL IMPRESSION:  Patient is a 73 y.o. F who was seen today for physical therapy evaluation and treatment for  shoulder and hip pain. Objectives as above, I think skilled PT services will be most helpful for her in terms of targeted strengthening for her R shoulder and R hip primarily. She perseverated heavily about her frustrations with her blood draw experience earlier this week- education given that this is out of PT scope and this therapist cannot provide professional consult on this situation. She will likely benefit from water PT as well. Will make every effort to improve pain and function moving forward.   OBJECTIVE IMPAIRMENTS: decreased mobility, difficulty walking, decreased strength, and pain.   ACTIVITY LIMITATIONS: carrying, lifting, squatting, stairs, toileting, dressing, self feeding, reach over head, and locomotion level  PARTICIPATION LIMITATIONS: meal prep, cleaning, laundry, driving, shopping, community activity, and yard work  PERSONAL FACTORS: Age,  Behavior pattern, Education, Fitness, Past/current experiences, and Time since onset of injury/illness/exacerbation are also affecting patient's functional outcome.   REHAB POTENTIAL: Good  CLINICAL DECISION MAKING: Stable/uncomplicated  EVALUATION COMPLEXITY: Low   GOALS: Goals reviewed with patient? No  SHORT TERM GOALS: Target date: 10/02/2024   Will be compliant with appropriate progressive HEP  Baseline: Goal status:  INITIAL  2.  Will complete 5xSTS test in 12 seconds or less to show improved mobility  Baseline:  Goal status: INITIAL  3.  Pain in R shoulder and R hip to be no more than 5/10 at worst  Baseline:  Goal status: INITIAL  4.  Will demonstrate improved awareness of functional posture with all tasks  Baseline:  Goal status: INITIAL    LONG TERM GOALS: Target date: 10/30/2024    MMT to have improved by one grade in all weak groups  Baseline:  Goal status: INITIAL  2.  R shoulder and R hip pain to be no more than 3/10 at worst  Baseline:  Goal status: INITIAL  3.  Will be able to sit for at least 45 minutes without increased R hip pain  Baseline:  Goal status: INITIAL  4.  Will be able to perform OH tasks and carry groceries with R arm without increased shoulder pain  Baseline:  Goal status: INITIAL  5.  Will be able to exercise as desired without increased R hip/shoulder pain  Baseline:  Goal status: INITIAL  6.  PSFS to have improved by at least 2 points  Baseline:  Goal status: INITIAL   PLAN:  PT FREQUENCY: 2x/week  PT DURATION: 8 weeks  PLANNED INTERVENTIONS: 97750- Physical Performance Testing, 97110-Therapeutic exercises, 97530- Therapeutic activity, [id number]- Neuromuscular re-education, 97535- Self Care, 02859- Manual therapy, and [phone]- Aquatic Therapy  PLAN FOR NEXT SESSION:   Land: needs HEP from our clinic- she has copies of old HEPS complete with exercises her MDs ruled out, build from this. General focus on functional progressive strengthening for upper and lower body otherwise  Water: per aquatic therapist discretion     Josette Rough, PT, DPT 09/04/24 11:16 AM   Date of referral: 08/12/24 Referring provider: Dr. Genelle  Referring diagnosis? M25.551 Treatment diagnosis? (if different than referring diagnosis)   M25.551, G89.29, M25.551, M62.81, R29.898  What was this (referring dx) caused by? Ongoing Issue  Lysle of Condition: Chronic  (continuous duration > 3 months)   Laterality: Rt  Current Functional Measure Score: Patient Specific Functional Scale 2.75  Objective measurements identify impairments when they are compared to normal values, the uninvolved extremity, and prior level of function.  [x]  Yes  []  No  Objective assessment of functional ability: Minimal functional limitations   Briefly describe symptoms: pain in R hip and R shoulder chronically  How did symptoms start: insidiously   Average pain intensity:  Last 24 hours: R shoulder 7/10, R hip 3/10  Past week: R shoulder 5/10, R hip 3/10  How often does the pt experience symptoms? Constantly  How much have the symptoms interfered with usual daily activities? Moderately  How has condition changed since care began at this facility? NA - initial visit  In general, how is the patients overall health? Very Good   BACK PAIN (STarT Back Screening Tool) No

## 2024-09-07 DIAGNOSIS — Z9011 Acquired absence of right breast and nipple: Secondary | ICD-10-CM | POA: Diagnosis not present

## 2024-09-07 DIAGNOSIS — Z789 Other specified health status: Secondary | ICD-10-CM | POA: Diagnosis not present

## 2024-09-07 DIAGNOSIS — T466X5A Adverse effect of antihyperlipidemic and antiarteriosclerotic drugs, initial encounter: Secondary | ICD-10-CM | POA: Diagnosis not present

## 2024-09-07 DIAGNOSIS — G720 Drug-induced myopathy: Secondary | ICD-10-CM | POA: Diagnosis not present

## 2024-09-08 ENCOUNTER — Other Ambulatory Visit (HOSPITAL_COMMUNITY): Payer: Self-pay

## 2024-09-09 DIAGNOSIS — Z87768 Personal history of other specified (corrected) congenital malformations of integument, limbs and musculoskeletal system: Secondary | ICD-10-CM | POA: Diagnosis not present

## 2024-09-09 DIAGNOSIS — M419 Scoliosis, unspecified: Secondary | ICD-10-CM | POA: Diagnosis not present

## 2024-09-09 DIAGNOSIS — Z0000 Encounter for general adult medical examination without abnormal findings: Secondary | ICD-10-CM | POA: Diagnosis not present

## 2024-09-09 DIAGNOSIS — Z9013 Acquired absence of bilateral breasts and nipples: Secondary | ICD-10-CM | POA: Diagnosis not present

## 2024-09-09 DIAGNOSIS — I10 Essential (primary) hypertension: Secondary | ICD-10-CM | POA: Diagnosis not present

## 2024-09-09 DIAGNOSIS — M549 Dorsalgia, unspecified: Secondary | ICD-10-CM | POA: Diagnosis not present

## 2024-09-09 DIAGNOSIS — Z853 Personal history of malignant neoplasm of breast: Secondary | ICD-10-CM | POA: Diagnosis not present

## 2024-09-09 DIAGNOSIS — J45909 Unspecified asthma, uncomplicated: Secondary | ICD-10-CM | POA: Diagnosis not present

## 2024-09-09 NOTE — Progress Notes (Unsigned)
 Chief Complaint:abdominal pain Primary GI Doctor:***  HPI:  Patient is a  73  year old female patient with past medical history of chronic constipation, history of colonic polyps,dyslipidemia,mitral valve prolapse,History of chronic costochondritis secondary to scoliosis, and Hypothyroidism, who was self referred to me  for a evaluation of abdominal pain.  07/08/24 cardiology visit, reviewed entire note.    Patient has been seen by [facility] in the past.  Interval History  Patient admits/denies GERD Patient admits/denies dysphagia Patient admits/denies nausea, vomiting, or weight loss  Patient admits/denies altered bowel habits Patient admits/denies abdominal pain Patient admits/denies rectal bleeding   Denies/Admits alcohol Denies/Admits smoking Denies/Admits NSAID use. Denies/Admits they are on blood thinners.  Surgical history:  Patient's family history includes  GI Procedures: Surveillance colonoscopy 09/01/2015 by Dr. Jeri for personal history of colon polyp - Non-thrombosed external hemorrhoids found on perianal exam. - The entire examined colon is normal on direct and retroflexion views. - No specimens collected.  The patient also had gastritis on upper endoscopy in 2012.   Wt Readings from Last 3 Encounters:  07/30/24 142 lb (64.4 kg)  07/28/24 140 lb 3.2 oz (63.6 kg)  07/08/24 144 lb (65.3 kg)      Past Medical History:  Diagnosis Date   Asthma    Breast cancer (HCC)    Cancer (HCC)    Depression    Dysrhythmia    History of chicken pox    Hyperlipemia    Hypothyroidism    Scoliosis     Past Surgical History:  Procedure Laterality Date   ABDOMINAL HYSTERECTOMY     CHOLECYSTECTOMY     COLONOSCOPY WITH PROPOFOL  N/A 09/01/2015   Procedure: COLONOSCOPY WITH PROPOFOL ;  Surgeon: Donnice Vaughn Jeri, MD;  Location: [facility] ENDOSCOPY;  Service: Endoscopy;  Laterality: N/A;   COLONOSCOPY WITH PROPOFOL  N/A 03/21/2021   Procedure: COLONOSCOPY WITH  PROPOFOL ;  Surgeon: [name], MD;  Location: [facility] ENDOSCOPY;  Service: Endoscopy;  Laterality: N/A;   MASTECTOMY     MYRINGOTOMY WITH TUBE PLACEMENT Right 01/24/2022   Procedure: MYRINGOTOMY WITH TUBE PLACEMENT;  Surgeon: [name], MD;  Location: [facility];  Service: ENT;  Laterality: Right;  pt. request later in morning 8 am   nare polypectomy Left    SPINAL FUSION     UPPER GI ENDOSCOPY      Current Outpatient Medications  Medication Sig Dispense Refill   albuterol (VENTOLIN HFA) 108 (90 Base) MCG/ACT inhaler SMARTSIG:1-2 Puff(s) Via Inhaler Every 6 Hours PRN     amLODipine (NORVASC) 2.5 MG tablet Take 2.5 mg by mouth daily.     amphetamine-dextroamphetamine (ADDERALL) 10 MG tablet Take 30 mg by mouth every morning.     ascorbic acid (VITAMIN C) 1000 MG tablet Take 500 mg by mouth daily.     Bempedoic Acid-Ezetimibe (NEXLIZET ) 180-10 MG TABS Take 1 tablet by mouth daily. 30 tablet 11   calcium carbonate (TUMS - DOSED IN MG ELEMENTAL CALCIUM) 500 MG chewable tablet Chew 1 tablet by mouth daily. (Patient not taking: Reported on 07/30/2024)     clobetasol  ointment (TEMOVATE ) 0.05 % Apply topically 2 (two) times daily. 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     cyclobenzaprine (FLEXERIL) 5 MG tablet Take 5 mg by mouth at bedtime as needed.     cycloSPORINE  (RESTASIS ) 0.05 % ophthalmic emulsion Place  1 drop into both eyes 2 (two) times daily. 0.4 mL 2   D-MANNOSE PO Take by mouth.     fluticasone-salmeterol (ADVAIR HFA) 230-21 MCG/ACT inhaler Inhale 2 puffs into the lungs 2 (two) times daily.     gabapentin  (NEURONTIN ) 100 MG capsule TAKE 1 TO 3 CAPSULES(100 TO 300 MG) BY MOUTH AT BEDTIME (Patient taking differently: as needed.) 90 capsule 0   hydrOXYzine (ATARAX) 25 MG tablet Take 25 mg by mouth daily.     levothyroxine (SYNTHROID) 125 MCG tablet Take 125  mcg by mouth daily.     mupirocin  ointment (BACTROBAN ) 2 % Apply 1 Application topically 2 (two) times daily. (Patient not taking: Reported on 07/30/2024) 22 g 0   NEOMYCIN -POLYMYXIN-HYDROCORTISONE (CORTISPORIN) 1 % SOLN OTIC solution Apply 1-2 drops to toe BID after soaking 10 mL 1   nitrofurantoin , macrocrystal-monohydrate, (MACROBID ) 100 MG capsule Take 1 capsule (100 mg total) by mouth daily. 90 capsule 3   tiZANidine  (ZANAFLEX ) 4 MG tablet Take 1 tablet (4 mg total) by mouth every 6 (six) hours as needed for muscle spasms. 30 tablet 2   triamcinolone  (NASACORT ) 55 MCG/ACT AERO nasal inhaler Place into the nose.     triamcinolone  cream (KENALOG ) 0.1 % Apply 1 Application topically 2 (two) times daily as needed. 45 g 5   zolpidem (AMBIEN) 5 MG tablet Take 5 mg by mouth at bedtime as needed.     No current facility-administered medications for this visit.    Allergies as of 09/10/2024 - Review Complete 09/04/2024  Allergen Reaction Noted   Lortab [hydrocodone-acetaminophen ] Other (See Comments) 11/30/2019   Statins  06/11/2023    Family History  Problem Relation Age of Onset   Multiple sclerosis Mother    Prostate cancer Maternal Uncle    Colon cancer Maternal Uncle    Colon cancer Maternal Grandmother     Review of Systems:    Constitutional: No weight loss, fever, chills, weakness or fatigue HEENT: Eyes: No change in vision               Ears, Nose, Throat:  No change in hearing or congestion Skin: No rash or itching Cardiovascular: No chest pain, chest pressure or palpitations   Respiratory: No SOB or cough Gastrointestinal: See HPI and otherwise negative Genitourinary: No dysuria or change in urinary frequency Neurological: No headache, dizziness or syncope Musculoskeletal: No new muscle or joint pain Hematologic: No bleeding or bruising Psychiatric: No history of depression or anxiety    Physical Exam:  Vital signs: There were no vitals taken for this  visit.  Constitutional:   Pleasant *** female/female appears to be in NAD, Well developed, Well nourished, alert and cooperative Eyes:   PEERL, EOMI. No icterus. Conjunctiva pink. Neck:  Supple Throat: Oral cavity and pharynx without inflammation, swelling or lesion.  Respiratory: Respirations even and unlabored. Lungs clear to auscultation bilaterally.   No wheezes, crackles, or rhonchi.  Cardiovascular: Normal S1, S2. Regular rate and rhythm. No peripheral edema, cyanosis or pallor.  Gastrointestinal:  Soft, nondistended, nontender. No rebound or guarding. Normal bowel sounds. No appreciable masses or hepatomegaly. Rectal:  Not performed.  Anoscopy: Msk:  Symmetrical without gross deformities. Without edema, no deformity or joint abnormality.  Neurologic:  Alert and  oriented x4;  grossly normal neurologically.  Skin:   Dry and intact without significant lesions or rashes.  RELEVANT LABS AND IMAGING: CBC    Latest Ref Rng & Units 07/31/2023    8:45 AM 01/03/2015  11:55 AM 12/28/2013   10:00 AM  CBC  WBC 4.0 - 10.5 K/uL 3.7  3.6  3.8   Hemoglobin 12.0 - 15.0 g/dL 86.6  86.2  86.6   Hematocrit 36.0 - 46.0 % 39.9  41.2  38.6   Platelets 150.0 - 400.0 K/uL 312.0  272  224      CMP     Latest Ref Rng & Units 07/31/2023    8:45 AM 01/03/2015   11:55 AM 12/28/2013   10:00 AM  CMP  Glucose 70 - 99 mg/dL 91  86  84   BUN 6 - 23 mg/dL 13  11  12    Creatinine 0.40 - 1.20 mg/dL 9.32  9.35  9.43   Sodium 135 - 145 mEq/L 136  134  138   Potassium 3.5 - 5.1 mEq/L 4.6  3.7  4.2   Chloride 96 - 112 mEq/L 100  101  105   CO2 19 - 32 mEq/L 28  27  31    Calcium 8.4 - 10.5 mg/dL 9.9  8.5  9.1   Total Protein 6.0 - 8.3 g/dL 7.3  7.6  7.5   Total Bilirubin 0.2 - 1.2 mg/dL 0.5  0.3  0.4   Alkaline Phos 39 - 117 U/L 61  66  70   AST 0 - 37 U/L 25  34  23   ALT 0 - 35 U/L 21  23  27       Lab Results  Component Value Date   TSH 2.18 01/03/2015  08/17/24 echo-  Left ventricular ejection fraction, by  estimation, is 60 to 65%.   Assessment: Hx of Ca Breast -1998 s/p Rt mastectomy and also had positive axillary adenopathy.  Plan: 1. ***   Thank you for the courtesy of this consult. Please call me with any questions or concerns.   Kore Madlock, FNP-C [facility] 09/09/2024, 4:37 PM  Cc: [name], MD

## 2024-09-09 NOTE — Progress Notes (Signed)
 Goals     .  Blood Pressure < 130/80      Vitals:   07/28/19 0926  BP: 142/84  Pulse: 70  SpO2: 95%  Weight: 71.8 kg (158 lb 3.2 oz)  Height: 174 cm (5' 8.5)  PainSc: 0-No pain       .  Follow my doctor's care plan      Follow my doctors treatment plan and follow-up as scheduled. Take all medications as prescribed and report any changes as necessary.     . * Reduce Stress/Anxiety (pt-stated)       *Some images could not be shown.

## 2024-09-09 NOTE — Progress Notes (Signed)
 Chief Complaint  Patient presents with  . Annual Exam    Physical     HPI  Tanya Snyder is a 73 y.o. here for an Annual Physical and a Medicare Wellness visit  Moved from [place]  to [facility]  Hx of Ca Breast -1998 s/p Rt mastectomy and also had positive axillary adenopathy.Hx of Hypothyroidism, Scoliosis - s/p surgery in 1965. Has been worried about her son's health (Brain Ca )  Has been having Family Stress  Now has hearing aids  Sees ENT- Dr. Milissa  States her balance has not been great  Has been working out at the [employer] 3 days a week  Wears compression stockings that seems to help with swelling of feet  States she has problems with sleep maintenance- Sometimes takes an antihistamine  ADDERALL helps with focus  States her itching has improved - No further allergy shots  Left shoulder continues to hurt - massaging it helps Denies Chest pains. Has some shortness of breath from Scoliosis and Asthma. Ex  Smoker; Quit several yrs back. No alcohol. Labs: Hgb; 13.4, Sugar; 91 A1c; 5.8 ,Se Creat; 0.5 Ca 125;,Total Cholesterol: 243,Triglycerides; 83,TSH; 2.528  and CEA: 2.4  and CA 125 ; 25    ROS Rest of 10 point review of systems is normal.  Outpatient Encounter Medications as of 09/09/2024  Medication Sig Dispense Refill  . ADVAIR HFA 115-21 mcg/actuation inhaler Inhale 2 inhalations into the lungs every 12 (twelve) hours 12 g 12  . albuterol 90 mcg/actuation inhaler Inhale 2 inhalations into the lungs every 6 (six) hours as needed for Wheezing 1 each 5  . aluminum hydroxide-magnesium carbonate (GAVISCON) 95-358 mg/15 mL oral suspension Take by mouth as needed.    SABRA amLODIPine (NORVASC) 2.5 MG tablet Take 1 tablet (2.5 mg total) by mouth once daily 90 tablet 1  . ascorbic acid (VITAMIN C) 1000 MG tablet Take 1,000 mg by mouth once daily.    SABRA bempedoic acid-ezetimibe (NEXLIZET ) 180-10 mg Tab Take by mouth    . calcium carbonate (TUMS E-X) 300 mg (750 mg) chewable tablet Take  by mouth as needed.    . chlorpheniramine (CHLOR-TRIMETON) 4 mg tablet 1 tablet as needed Orally every 6 hrs    . clobetasoL  (TEMOVATE ) 0.05 % ointment APPLY TOPICALLY TO THE AFFECTED AREA 2 TIMES A WEEK AS NEEDED 60 g 1  . clonazePAM (KLONOPIN) 1 MG tablet TAKE 1 TABLET(1 MG) BY MOUTH DAILY AS NEEDED FOR ANXIETY 30 tablet 1  . CRAN/VITC/MANNOSE/FOS/BROMELN (CYSTEX CRANBERRY ORAL) Take by mouth as needed    . cyclobenzaprine (FLEXERIL) 5 MG tablet     . D-MANNOSE ORAL Take by mouth.    . estradioL (ESTRACE) 0.01 % (0.1 mg/gram) vaginal cream Insert fingertip unit amount into vagina nightly x 2 weeks and then twice weekly for up to 2 months 42.5 g 2  . gabapentin  (NEURONTIN ) 100 MG capsule Take 100 mg 1-3 capsules at night (Patient taking differently: Take 100 mg 1-3 capsules at night as needed) 90 capsule 1  . guaifenesin/pseudoephedrne HCl (MUCINEX D ORAL) Take by mouth As needed    . hydrogen peroxide 1.5 % Soln external solution Apply topically as needed 236 mL 2  . hydrOXYzine (ATARAX) 25 MG tablet Take 1 tablet (25 mg total) by mouth once daily as needed for Itching 30 tablet 1  . LACTOBACILLUS ACIDOPHILUS (ACIDOPHILUS ORAL) Take 1 capsule by mouth as needed 1-3 times a day    . levothyroxine (SYNTHROID) 125 MCG tablet TAKE 1  TABLET(125 MCG) BY MOUTH EVERY DAY 30 TO 60 MINUTES BEFORE BREAKFAST ON AN EMPTY STOMACH AND WITH A GLASS OF WATER 90 tablet 1  . meloxicam (MOBIC) 7.5 MG tablet Take 7.5 mg by mouth once daily    . montelukast (SINGULAIR) 10 mg tablet 1 tablet Orally Once a day    . mupirocin  (BACTROBAN ) 2 % ointment Apply topically as needed      . nitrofurantoin , macrocrystal-monohydrate, (MACROBID ) 100 MG capsule Take 1 capsule (100 mg total) by mouth at bedtime for 90 days 90 capsule 0  . REPATHA  SURECLICK 140 mg/mL PnIj Inject subcutaneously    . sulfamethoxazole-trimethoprim (BACTRIM DS) 800-160 mg tablet Take 1 tablet by mouth 2 (two) times daily    . tiZANidine  (ZANAFLEX ) 4 MG  tablet Take 4 mg by mouth nightly as needed    . triamcinolone  (NASACORT  AQ) 55 mcg nasal spray Place 2 sprays into both nostrils as needed       . triamcinolone  0.1 % cream Apply topically 2 (two) times daily 30 g 3  . UNABLE TO FIND Nasal gel uses every other night    . white petrolatum-mineral oiL (SOOTHE NIGHT TIME LUBRICANT) 80-20 % ophthalmic ointment Place into both eyes nightly (Patient taking differently: Place into both eyes at bedtime 4 times a day) 5 g 5  . zolpidem (AMBIEN) 5 MG tablet TAKE 1 TABLET(5 MG) BY MOUTH AT BEDTIME AS NEEDED FOR SLEEP 30 tablet 1  . dextroamphetamine-amphetamine (ADDERALL) 30 mg tablet Take 1 tablet (30 mg total) by mouth every morning for 30 days 30 tablet 0  . fluticasone propion-salmeteroL (WIXELA INHUB) 250-50 mcg/dose diskus inhaler Inhale 1 Puff into the lungs every 12 (twelve) hours for 180 days 60 each 5  . plecanatide  (TRULANCE ) 3 mg tablet Take 1 tablet (3 mg total) by mouth once daily for 180 days 30 tablet 5   No facility-administered encounter medications on file as of 09/09/2024.    Allergies as of 09/09/2024 - Reviewed 02/24/2024  Allergen Reaction Noted  . Lortab [hydrocodone-acetaminophen ] Headache 11/30/2019  . Statins-hmg-coa reductase inhibitors Other (See Comments) 06/11/2023    Past Medical History:  Diagnosis Date  . Allergies    Pt is getting allergy shots weekly  . Allergy 2009  . Anxiety   . Arthritis   . Asthma without status asthmaticus (HHS-HCC)   . Cancer (CMS/HHS-HCC)    breast cancer   . Depression   . GERD (gastroesophageal reflux disease)   . History of blood transfusion 1977  . History of cataract   . History of chicken pox   . Hyperlipidemia   . Hypertension 08-13-20  . Internal hemorrhoids 09/01/2015  . Lichen sclerosus   . Osteoporosis 1999  . PID (pelvic inflammatory disease)   . Psychological trauma 1965  . Thyroid  disease     Past Surgical History:  Procedure Laterality Date  . spinal fusion  surgery  1966  . HYSTERECTOMY  1996  . COLONOSCOPY  09/01/2015   Int. Hemorrhoids/Otherwise Normal/PHx of polyps/Repeat 77yrs/MGR  . BREAST BIOPSY  cancer  . BREAST SURGERY    . CHOLECYSTECTOMY    . DILATION AND CURETTAGE OF UTERUS  1977  . GYNECOLOGIC CRYOSURGERY  1996  . MASTECTOMY SIMPLE    . SPINE SURGERY    . THYROIDECTOMY  nodule    Vitals:   09/09/24 1053  BP: 136/72  Pulse: 61    Body mass index is 20.89 kg/m.  No visits with results within 3 Month(s) from this  visit.  Latest known visit with results is:  Appointment on 02/24/2024  Component Date Value Ref Range Status  . WBC (White Blood Cell Count) 02/24/2024 5.1  4.1 - 10.2 10^3/uL Final  . RBC (Red Blood Cell Count) 02/24/2024 4.28  4.04 - 5.48 10^6/uL Final  . Hemoglobin 02/24/2024 13.4  12.0 - 15.0 gm/dL Final  . Hematocrit 96/96/7974 39.9  35.0 - 47.0 % Final  . MCV (Mean Corpuscular Volume) 02/24/2024 93.2  80.0 - 100.0 fl Final  . MCH (Mean Corpuscular Hemoglobin) 02/24/2024 31.3 (H)  27.0 - 31.2 pg Final  . MCHC (Mean Corpuscular Hemoglobin * 02/24/2024 33.6  32.0 - 36.0 gm/dL Final  . Platelet Count 02/24/2024 274  150 - 450 10^3/uL Final  . RDW-CV (Red Cell Distribution Widt* 02/24/2024 12.6  11.6 - 14.8 % Final  . MPV (Mean Platelet Volume) 02/24/2024 9.2 (L)  9.4 - 12.4 fl Final  . Neutrophils 02/24/2024 3.16  1.50 - 7.80 10^3/uL Final  . Lymphocytes 02/24/2024 1.24  1.00 - 3.60 10^3/uL Final  . Monocytes 02/24/2024 0.41  0.00 - 1.50 10^3/uL Final  . Eosinophils 02/24/2024 0.22  0.00 - 0.55 10^3/uL Final  . Basophils 02/24/2024 0.05  0.00 - 0.09 10^3/uL Final  . Neutrophil % 02/24/2024 62.0  32.0 - 70.0 % Final  . Lymphocyte % 02/24/2024 24.3  10.0 - 50.0 % Final  . Monocyte % 02/24/2024 8.0  4.0 - 13.0 % Final  . Eosinophil % 02/24/2024 4.3  1.0 - 5.0 % Final  . Basophil% 02/24/2024 1.0  0.0 - 2.0 % Final  . Immature Granulocyte % 02/24/2024 0.4  <=0.7 % Final  . Immature Granulocyte Count  02/24/2024 0.02  <=0.06 10^3/L Final  . Glucose 02/24/2024 62 (L)  70 - 110 mg/dL Final  . Sodium 96/96/7974 140  136 - 145 mmol/L Final  . Potassium 02/24/2024 4.7  3.6 - 5.1 mmol/L Final  . Chloride 02/24/2024 105  97 - 109 mmol/L Final  . Carbon Dioxide (CO2) 02/24/2024 27.4  22.0 - 32.0 mmol/L Final  . Urea Nitrogen (BUN) 02/24/2024 20  7 - 25 mg/dL Final  . Creatinine 96/96/7974 0.7  0.6 - 1.1 mg/dL Final  . Glomerular Filtration Rate (eGFR) 02/24/2024 91  >60 mL/min/1.73sq m Final   CKD-EPI (2021) does not include patient's race in the calculation of eGFR.  Monitoring changes of plasma creatinine and eGFR over time is useful for monitoring kidney function.   Interpretive Ranges for eGFR (CKD-EPI 2021):  eGFR:       >60 mL/min/1.73 sq. m - Normal eGFR:       30-59 mL/min/1.73 sq. m - Moderately Decreased eGFR:       15-29 mL/min/1.73 sq. m  - Severely Decreased eGFR:       < 15 mL/min/1.73 sq. m  - Kidney Failure    Note: These eGFR calculations do not apply in acute situations when eGFR is changing rapidly or patients on dialysis.  . Calcium 02/24/2024 9.6  8.7 - 10.3 mg/dL Final  . AST  96/96/7974 26  8 - 39 U/L Final  . ALT  02/24/2024 24  5 - 38 U/L Final  . Alk Phos (alkaline Phosphatase) 02/24/2024 64  34 - 104 U/L Final  . Albumin 02/24/2024 4.7  3.5 - 4.8 g/dL Final  . Bilirubin, Total 02/24/2024 0.4  0.3 - 1.2 mg/dL Final  . Protein, Total 02/24/2024 6.8  6.1 - 7.9 g/dL Final  . A/G Ratio 96/96/7974 2.2  1.0 -  5.0 gm/dL Final  . Hemoglobin J8R 02/24/2024 5.8 (H)  4.2 - 5.6 % Final  . Average Blood Glucose (Calc) 02/24/2024 120  mg/dL Final  . Cholesterol, Total 02/24/2024 262 (H)  100 - 200 mg/dL Final  . Triglyceride 96/96/7974 82  35 - 199 mg/dL Final  . HDL (High Density Lipoprotein) Cho* 02/24/2024 90.0 (H)  35.0 - 85.0 mg/dL Final  . LDL Calculated 02/24/2024 843 (H)  0 - 130 mg/dL Final  . VLDL Cholesterol 02/24/2024 16  mg/dL Final  . Cholesterol/HDL Ratio  02/24/2024 2.9   Final  . Creatinine, Random Urine 02/24/2024 33.3 (L)  37.0 - 250.0 mg/dL Final  . Urine Albumin, Random 02/24/2024 8    mg/L Final  . Urine Albumin/Creatinine Ratio 02/24/2024 24.0  <30.0 ug/mg Final   Urine:         Spot collection              (g/mg creatinine)     Normal               < 30   Moderately          30-299         increased   Clinical             >=300 albuminuria  . Thyroid  Stimulating Hormone (TSH) 02/24/2024 2.720  0.450-5.330 uIU/ml uIU/mL Final   Reference Range for Pregnant Females >= 18 yrs old: Normal Range for 1st trimester: 0.05-3.70 ulU/ml Normal Range for 2nd trimester: 0.31-4.35 ulU/ml  . Color 02/24/2024 Light Yellow  Colorless, Straw, Light Yellow, Yellow, Dark Yellow Final  . Clarity 02/24/2024 Clear  Clear Final  . Specific Gravity 02/24/2024 1.007  1.005 - 1.030 Final  . pH, Urine 02/24/2024 5.0  5.0 - 8.0 Final  . Protein, Urinalysis 02/24/2024 Negative  Negative mg/dL Final  . Glucose, Urinalysis 02/24/2024 Negative  Negative mg/dL Final  . Ketones, Urinalysis 02/24/2024 Negative  Negative mg/dL Final  . Blood, Urinalysis 02/24/2024 Negative  Negative Final  . Nitrite, Urinalysis 02/24/2024 Negative  Negative Final  . Leukocyte Esterase, Urinalysis 02/24/2024 2+ (!)  Negative Final  . Bilirubin, Urinalysis 02/24/2024 Negative  Negative Final  . Urobilinogen, Urinalysis 02/24/2024 0.2  0.2 - 1.0 mg/dL Final  . WBC, UA 96/96/7974 2  <=5 /hpf Final  . Red Blood Cells, Urinalysis 02/24/2024 <1  <=3 /hpf Final  . Bacteria, Urinalysis 02/24/2024 0-5  0 - 5 /hpf Final  . Squamous Epithelial Cells, Urinaly* 02/24/2024 0  /hpf Final    Exam Blood pressure 136/72, pulse 61, height 174 cm (5' 8.5), weight 63.2 kg (139 lb 6.4 oz), SpO2 99%.  Wt Readings from Last 3 Encounters:  09/09/24 63.2 kg (139 lb 6.4 oz)  02/24/24 66.5 kg (146 lb 9.6 oz)  11/08/23 66.2 kg (146 lb)     General: Alert oriented x3  Skin: No suspicious  lesions or moles.   Eyes: Sclera and conjunctiva clear; pupils equal round and reactive to light . extraocular movements intact Ears: Normal. Nose: Mucosa healthy without drainage or ulceration Oropharynx: Normal  Neck: No swelling, masses, Some stiffness of paraspinal neck muscles, Carotid pulses normal bilaterally, no masses palpated. No bruits heard. Lungs: Respirations unlabored; clear to auscultation bilaterally Back: Scoliosis noted  Cardiovascular: Heart regular rate and rhythm without murmurs, gallops, or rubs Abdomen: Soft; non tender; non distended;  no masses or organomegaly PELVIC: Declined Lymph Nodes: No significant cervical or supraclavicular lymphadenopathy noted Musculoskeletal: Back deformity from Scoliosis noted .  Multiple surgical scars noted  Extremities: Normal, 1 +  edema Callus noted on feet  Neurologic: Alert and oriented; speech intact; face symmetrical; moves all extremities well     Assessment and Plan: 1 Emotional stress with Family situation and Insomnia; On Zolpidem  Also takes Xanax as needed Sees a Counselor Development worker, community )  2 Rt sided facial and Rt ear ache; Doing better- s/p Tympanostomy  Has a hearing aid  Sees Dr. Milissa  3 Sleep maintenance  insomnia; On Ambien  4 Fatigue and weakness: Hx of low B12- On Supplements  5 Chronic pain in upper back and shoulders/ hx of Scoliosis;-No longer on Percocet (Constipation and Hangover) Takes Tylenol  as needed  5 Hypothyroidism:On Synthroid 125 mcg po qd - TSH; 2.528  6 Hx of Asthma; Stable On Pro Air inhaler as needed 7  Hx of breast Ca ; S/p Bilat Mastectomies Ca 125 was ok (21) 8 Anxiety: Worries about her son and daughter's family -On Clonazepam.- Prescription sent  9 Hx of recurrent UTI's; On Cystex and Nitrofurantoin  50 mg po qd  10  Low Vit d : On supplements. 11 GERD; Doing better- On Omeprazole as needed  12 ADD :On  Adderall  13 Hx of MVPS/ Hyperlipidemia;On Zetia Sees Cardiology    14 HTN; Stable -On  Amlodipine 2.5 mg po qd - Continue current regimen  15 Health Maintenance; Up to date with Flu shot.,Tdap and COVID vaccine  Up to date with pap smear- sees GYN- Dr. Verdon  Colonoscopy approx 3 yrs back- polyps noted - Repeat Colonoscopy - Sept 2016- External hemorrhoids- other wise was ok  Repeated in March 2022: One 5 mm polyp in the descending colon, removed with  a cold snare. Resected and retrieved. - Non-bleeding internal hemorrhoids.  Prescription sent for meds  Check cbc,Met-c, lipids, ua and TSH 1 week prior to next visit Follow up in 6 months     Vishwanath Hande MD  Tanya Snyder is a 73 y.o. here for Medicare Wellness Visit  MEDICARE WELLNESS VISIT  Providers Rendering Care 1. Dr. Tamra Leventhal (PCP)  Functional Assessment (1) Hearing: Demonstrates difficulty in hearing during normal conversation Wears hearing aids (2) Risk of Falls: Patient denies any falls or near falls in the last year, Gait steady without assistance during walk from waiting area to exam room (3) Home Safety: Patient feels secure in their home, There are operational smoke alarms in multiple areas of the home (4) Activities of Daily Living: Independently manages personal grooming and household chores, including cooking, cleaning and laundry. Manages Personal finances without assistance.  Depression Screening PHQ 2/9 last 3 flowsheet values     08/07/2023    4:45 PM 11/08/2023   11:03 AM 09/09/2024   10:54 AM  PHQ-2/9 Depression Screening   Little interest or pleasure in doing things 1 1 0  Feeling down, depressed, or hopeless  1 0  Patient Health Questionnaire-2 Score  2 * 0  Trouble falling or staying asleep, or sleeping too much  2   Feeling tired or having little energy  2   Poor appetite or overeating  1   Feeling bad about yourself - or that you are a failure or have let yourself or your family down  1   Trouble concentrating on things, such as  reading the newspaper or watching television  1   Moving or speaking so slowly that other people could have noticed? Or the opposite - being so fidgety or restless that you  have been moving around a lot more than usual.  0   Thoughts that you would be better off dead or hurting yourself in some way  1   Patient Health Questionnaire-9 Score  10     * Data saved with a previous flowsheet row definition     Depression Severity and Treatment Recommendations:  0-4= None  5-9= Mild / Treatment: Support, educate to call if worse; return in one month  10-14= Moderate / Treatment: Support, watchful waiting; Antidepressant or Psychotherapy  15-19= Moderately severe / Treatment: Antidepressant OR Psychotherapy  >= 20 = Major depression, severe / Antidepressant AND Psychotherapy Emotionally stressed  Cognitive Impairment Patient denies episodes of loosing things, being forgetful. Seems oriented to person, place and time.  Responses appear appropriate and timely to this observer.  PREVENTION PLAN  Cardiovascular: FLP assessed Diabetes: A1c or FBG assessed Glaucoma: N/A Hepatitis B (HBV) Vaccine:  Not Applicable Smoking Cessation:  Not Applicable  Other Personalized Health Advice  Encouraged patient to exercise 5 days a week, walking, water aerobics, gentle stretching recommended. Increase dietary intake of fresh fruits and vegetables, reduce red meat to twice a week.  End of Life Counseling Patient has living will in place; Has deignated her lawyer Chyrl iddings as her POA - ; Full Code Declines prolonged and futile life sustaining measures  Current Outpatient Medications  Medication Sig Dispense Refill  . ADVAIR HFA 115-21 mcg/actuation inhaler Inhale 2 inhalations into the lungs every 12 (twelve) hours 12 g 12  . albuterol 90 mcg/actuation inhaler Inhale 2 inhalations into the lungs every 6 (six) hours as needed for Wheezing 1 each 5  . aluminum hydroxide-magnesium carbonate (GAVISCON)  95-358 mg/15 mL oral suspension Take by mouth as needed.    SABRA amLODIPine (NORVASC) 2.5 MG tablet Take 1 tablet (2.5 mg total) by mouth once daily 90 tablet 1  . ascorbic acid (VITAMIN C) 1000 MG tablet Take 1,000 mg by mouth once daily.    SABRA bempedoic acid-ezetimibe (NEXLIZET ) 180-10 mg Tab Take by mouth    . calcium carbonate (TUMS E-X) 300 mg (750 mg) chewable tablet Take by mouth as needed.    . chlorpheniramine (CHLOR-TRIMETON) 4 mg tablet 1 tablet as needed Orally every 6 hrs    . clobetasoL  (TEMOVATE ) 0.05 % ointment APPLY TOPICALLY TO THE AFFECTED AREA 2 TIMES A WEEK AS NEEDED 60 g 1  . clonazePAM (KLONOPIN) 1 MG tablet TAKE 1 TABLET(1 MG) BY MOUTH DAILY AS NEEDED FOR ANXIETY 30 tablet 1  . CRAN/VITC/MANNOSE/FOS/BROMELN (CYSTEX CRANBERRY ORAL) Take by mouth as needed    . cyclobenzaprine (FLEXERIL) 5 MG tablet     . D-MANNOSE ORAL Take by mouth.    . estradioL (ESTRACE) 0.01 % (0.1 mg/gram) vaginal cream Insert fingertip unit amount into vagina nightly x 2 weeks and then twice weekly for up to 2 months 42.5 g 2  . gabapentin  (NEURONTIN ) 100 MG capsule Take 100 mg 1-3 capsules at night (Patient taking differently: Take 100 mg 1-3 capsules at night as needed) 90 capsule 1  . guaifenesin/pseudoephedrne HCl (MUCINEX D ORAL) Take by mouth As needed    . hydrogen peroxide 1.5 % Soln external solution Apply topically as needed 236 mL 2  . hydrOXYzine (ATARAX) 25 MG tablet Take 1 tablet (25 mg total) by mouth once daily as needed for Itching 30 tablet 1  . LACTOBACILLUS ACIDOPHILUS (ACIDOPHILUS ORAL) Take 1 capsule by mouth as needed 1-3 times a day    . levothyroxine (SYNTHROID)  125 MCG tablet TAKE 1 TABLET(125 MCG) BY MOUTH EVERY DAY 30 TO 60 MINUTES BEFORE BREAKFAST ON AN EMPTY STOMACH AND WITH A GLASS OF WATER 90 tablet 1  . meloxicam (MOBIC) 7.5 MG tablet Take 7.5 mg by mouth once daily    . montelukast (SINGULAIR) 10 mg tablet 1 tablet Orally Once a day    . mupirocin  (BACTROBAN ) 2 %  ointment Apply topically as needed      . nitrofurantoin , macrocrystal-monohydrate, (MACROBID ) 100 MG capsule Take 1 capsule (100 mg total) by mouth at bedtime for 90 days 90 capsule 0  . REPATHA  SURECLICK 140 mg/mL PnIj Inject subcutaneously    . sulfamethoxazole-trimethoprim (BACTRIM DS) 800-160 mg tablet Take 1 tablet by mouth 2 (two) times daily    . tiZANidine  (ZANAFLEX ) 4 MG tablet Take 4 mg by mouth nightly as needed    . triamcinolone  (NASACORT  AQ) 55 mcg nasal spray Place 2 sprays into both nostrils as needed       . triamcinolone  0.1 % cream Apply topically 2 (two) times daily 30 g 3  . UNABLE TO FIND Nasal gel uses every other night    . white petrolatum-mineral oiL (SOOTHE NIGHT TIME LUBRICANT) 80-20 % ophthalmic ointment Place into both eyes nightly (Patient taking differently: Place into both eyes at bedtime 4 times a day) 5 g 5  . zolpidem (AMBIEN) 5 MG tablet TAKE 1 TABLET(5 MG) BY MOUTH AT BEDTIME AS NEEDED FOR SLEEP 30 tablet 1  . dextroamphetamine-amphetamine (ADDERALL) 30 mg tablet Take 1 tablet (30 mg total) by mouth every morning for 30 days 30 tablet 0  . fluticasone propion-salmeteroL (WIXELA INHUB) 250-50 mcg/dose diskus inhaler Inhale 1 Puff into the lungs every 12 (twelve) hours for 180 days 60 each 5  . plecanatide  (TRULANCE ) 3 mg tablet Take 1 tablet (3 mg total) by mouth once daily for 180 days 30 tablet 5   No current facility-administered medications for this visit.    Allergies as of 09/09/2024 - Reviewed 02/24/2024  Allergen Reaction Noted  . Lortab [hydrocodone-acetaminophen ] Headache 11/30/2019  . Statins-hmg-coa reductase inhibitors Other (See Comments) 06/11/2023    Patient Active Problem List  Diagnosis  . Mid back pain, chronic  . Personal history of breast cancer  . Asthma without status asthmaticus (HHS-HCC)  . Depression  . Hyperlipidemia  . Thyroid  disease  . Enthesopathy of right hip  . Scoliosis/kyphoscoliosis  . Chronic bilateral low  back pain without sciatica  . History of recurrent UTIs  . ADD (attention deficit disorder) without hyperactivity  . Malignant neoplasm of right female breast (CMS/HHS-HCC)  . Urinary tract infection  . Bilateral renal masses  . Status post right mastectomy  . Other specified acquired hypothyroidism  . Mitral valve prolapse  . Scoliosis of thoracic spine  . Esophageal reflux  . Palpitations  . Allergic rhinitis  . Mild persistent asthma, uncomplicated (HHS-HCC)  . Dizziness  . Imbalance  . Numbness in feet  . Hx of colonic polyps  . Polyp of descending colon  . Prediabetes  . Agatston CAC score, <100  . Atypical chest pain  . Cataracts, bilateral  . Dry eye  . Neuropathy  . Pain in joint of right hip  . Statin myopathy    Past Medical History:  Diagnosis Date  . Allergies    Pt is getting allergy shots weekly  . Allergy 2009  . Anxiety   . Arthritis   . Asthma without status asthmaticus (HHS-HCC)   . Cancer (  CMS/HHS-HCC)    breast cancer   . Depression   . GERD (gastroesophageal reflux disease)   . History of blood transfusion 1977  . History of cataract   . History of chicken pox   . Hyperlipidemia   . Hypertension 08-13-20  . Internal hemorrhoids 09/01/2015  . Lichen sclerosus   . Osteoporosis 1999  . PID (pelvic inflammatory disease)   . Psychological trauma 1965  . Thyroid  disease     Past Surgical History:  Procedure Laterality Date  . spinal fusion surgery  1966  . HYSTERECTOMY  1996  . COLONOSCOPY  09/01/2015   Int. Hemorrhoids/Otherwise Normal/PHx of polyps/Repeat 63yrs/MGR  . BREAST BIOPSY  cancer  . BREAST SURGERY    . CHOLECYSTECTOMY    . DILATION AND CURETTAGE OF UTERUS  1977  . GYNECOLOGIC CRYOSURGERY  1996  . MASTECTOMY SIMPLE    . SPINE SURGERY    . THYROIDECTOMY  nodule    Health Maintenance  Topic Date Due  . Hepatitis C Screen  Never done  . RSV Immunization Pregnant or 60+ (1 - Risk 60-74 years 1-dose series) Never done  .  Medicare Subsequent AWV [id number]  02/16/2023  . Annual Physical/Well Child Check  08/07/2024  . TSH Level  02/23/2025  . Depression Screening  09/09/2025  . DXA Bone Density Scan  12/05/2025  . Colorectal Cancer Screening  03/21/2026  . Adult Tetanus (Td And Tdap)  08/15/2027  . Lipid Panel  02/23/2029  . Medicare Initial AWV [id number]  Completed  . Pneumococcal Vaccine: 50+  Completed  . Shingrix  Completed  . Influenza Vaccine  Completed  . Hib Vaccines  Aged Out  . Hepatitis A Vaccines  Aged Out  . Meningococcal B Vaccine  Aged Out  . Meningococcal ACWY Vaccine  Aged Out  . HPV Vaccines  Aged Out  . Mammogram  Discontinued    Vitals:   09/09/24 1053  BP: 136/72  Pulse: 61  SpO2: 99%  Weight: 63.2 kg (139 lb 6.4 oz)  Height: 174 cm (5' 8.5)  PainSc: 0-No pain   Body mass index is 20.89 kg/m.  Assessment/Plan  1. Medicare wellness visit- Medications and allergies reviewed. Copy of preventative health provided.  Labs reviewed.   TAMRA ETHELENE LEVENTHAL, MD        *Some images could not be shown.

## 2024-09-10 ENCOUNTER — Telehealth (HOSPITAL_BASED_OUTPATIENT_CLINIC_OR_DEPARTMENT_OTHER): Payer: Self-pay | Admitting: Certified Nurse Midwife

## 2024-09-10 ENCOUNTER — Ambulatory Visit: Admitting: Gastroenterology

## 2024-09-10 ENCOUNTER — Encounter: Payer: Self-pay | Admitting: Physician Assistant

## 2024-09-10 NOTE — Telephone Encounter (Signed)
New message    Calling for test results  

## 2024-09-10 NOTE — Telephone Encounter (Signed)
 Called and spoke with patient. She meant to call her Cardiologist office.   Tanya Snyder

## 2024-09-11 LAB — CK: Total CK: 71 U/L (ref 32–182)

## 2024-09-11 LAB — NMR, LIPOPROFILE
Cholesterol, Total: 191 mg/dL (ref 100–199)
HDL Particle Number: 38.4 umol/L (ref >=30.5)
HDL-C: 96 mg/dL (ref >39)
LDL Particle Number: 665 nmol/L (ref <1000)
LDL Size: 21.1 nm (ref >20.5)
LDL-C (NIH Calc): 88 mg/dL (ref 0–99)
LP-IR Score: <25 (ref <=45)
Small LDL Particle Number: <90 nmol/L (ref <=527)
Triglycerides: 43 mg/dL (ref 0–149)

## 2024-09-11 LAB — COMPREHENSIVE METABOLIC PANEL WITH GFR
ALT: 22 IU/L (ref 0–32)
AST: 22 IU/L (ref 0–40)
Albumin: 4.9 g/dL — ABNORMAL HIGH (ref 3.8–4.8)
Alkaline Phosphatase: 60 IU/L (ref 51–125)
BUN/Creatinine Ratio: 27 (ref 12–28)
BUN: 19 mg/dL (ref 8–27)
Bilirubin Total: 0.5 mg/dL (ref 0.0–1.2)
CO2: 23 mmol/L (ref 20–29)
Calcium: 9.8 mg/dL (ref 8.7–10.3)
Chloride: 97 mmol/L (ref 96–106)
Creatinine, Ser: 0.71 mg/dL (ref 0.57–1.00)
Globulin, Total: 1.8 g/dL (ref 1.5–4.5)
Glucose: 93 mg/dL (ref 70–99)
Potassium: 4.8 mmol/L (ref 3.5–5.2)
Sodium: 133 mmol/L — ABNORMAL LOW (ref 134–144)
Total Protein: 6.7 g/dL (ref 6.0–8.5)
eGFR: 90 mL/min/1.73 (ref >59)

## 2024-09-14 ENCOUNTER — Ambulatory Visit: Payer: Self-pay | Admitting: Nurse Practitioner

## 2024-09-16 ENCOUNTER — Other Ambulatory Visit (HOSPITAL_BASED_OUTPATIENT_CLINIC_OR_DEPARTMENT_OTHER): Payer: Self-pay

## 2024-09-16 MED ORDER — COMIRNATY 30 MCG/0.3ML IM SUSY
0.3000 mL | PREFILLED_SYRINGE | Freq: Once | INTRAMUSCULAR | 0 refills | Status: AC
  Filled 2024-09-16: qty 0.3, 1d supply, fill #0

## 2024-09-18 ENCOUNTER — Telehealth: Payer: Self-pay

## 2024-09-18 NOTE — Telephone Encounter (Signed)
 Copied from CRM [id number]. Topic: Appointments - Scheduling Inquiry for Clinic >> Sep 18, 2024  9:05 AM Berneda FALCON wrote: Reason for CRM: Patient would like to be seen by Dr. Rollene as a new patient, previous PCP recommended it to her and she is new to the area. She would really like to make her, her primary PCP if possible please.  Patient callback is [phone]

## 2024-09-18 NOTE — Telephone Encounter (Signed)
 Not taking np sorry

## 2024-09-21 NOTE — Telephone Encounter (Signed)
 Called pt and have her the message that Dr Rollene is no longer accepting new patients at this time but offered other [place] offices to her to see if they are

## 2024-09-28 DIAGNOSIS — R2231 Localized swelling, mass and lump, right upper limb: Secondary | ICD-10-CM | POA: Diagnosis not present

## 2024-10-05 ENCOUNTER — Encounter: Payer: Self-pay | Admitting: Family Medicine

## 2024-10-05 ENCOUNTER — Ambulatory Visit: Admitting: Family Medicine

## 2024-10-05 VITALS — BP 98/48 | HR 53 | Temp 98.2°F | Ht 67.5 in | Wt 135.8 lb

## 2024-10-05 DIAGNOSIS — L821 Other seborrheic keratosis: Secondary | ICD-10-CM

## 2024-10-05 DIAGNOSIS — M545 Low back pain, unspecified: Secondary | ICD-10-CM

## 2024-10-05 DIAGNOSIS — F331 Major depressive disorder, recurrent, moderate: Secondary | ICD-10-CM | POA: Diagnosis not present

## 2024-10-05 DIAGNOSIS — G8929 Other chronic pain: Secondary | ICD-10-CM

## 2024-10-05 DIAGNOSIS — C50911 Malignant neoplasm of unspecified site of right female breast: Secondary | ICD-10-CM | POA: Diagnosis not present

## 2024-10-05 MED ORDER — GABAPENTIN 100 MG PO CAPS: ORAL_CAPSULE | ORAL | 2 refills | Status: AC

## 2024-10-05 NOTE — Progress Notes (Unsigned)
 Acute Office Visit  Subjective:     Patient ID: Tanya Snyder, female    DOB: December 17, 1951, 73 y.o.   MRN: [id number]  Chief Complaint  Patient presents with   Skin Problem    Patient complains of a new mole on her back x2 months    Pt is here for a new mole that she found on her back. States that it is a little itchy and is getting a little bigger, no redness, no tenderness to palpation.   Pt also requesting today that I assume prescribing for her Ambien, adderall,  and alprazolam, which was previously prescribed by her internist in [place]. States that it is a long drive and wants to switch her care. I spent 20 minutes reviewing the risks/benefits of long term benzodiazepines and hypnotics, including the risk of dementia, falls, dependency, etc. Pt states she is only taking the alprazolam 1-2 times per week, not taking the ambien every night but is taking it most nights of the week. Pt states she also isn't taking the adderall daily, just on days where she feels particularly unmotivated.  PDMP also reviewed.     Review of Systems  All other systems reviewed and are negative.       Objective:    BP (!) 98/48   Pulse (!) 53   Temp 98.2 F (36.8 C) (Oral)   Ht 5' 7.5 (1.715 m)   Wt 135 lb 12.8 oz (61.6 kg)   SpO2 98%   BMI 20.96 kg/m    Physical Exam Vitals reviewed.  Constitutional:      Appearance: Normal appearance. She is well-groomed and normal weight.  Cardiovascular:     Rate and Rhythm: Normal rate and regular rhythm.     Heart sounds: S1 normal and S2 normal.  Pulmonary:     Effort: Pulmonary effort is normal.     Breath sounds: Normal breath sounds and air entry.  Musculoskeletal:     Right lower leg: No edema.     Left lower leg: No edema.  Neurological:     Mental Status: She is alert and oriented to person, place, and time. Mental status is at baseline.     Gait: Gait is intact.  Psychiatric:        Mood and Affect: Mood and affect  normal.        Speech: Speech normal.        Behavior: Behavior normal.        Judgment: Judgment normal.     No results found for any visits on 10/05/24.      Assessment & Plan:   Problem List Items Addressed This Visit       Unprioritized   Chronic bilateral low back pain without sciatica   Relevant Medications   Stable, chronic, no changes in her pain level today, will refill the gabapentin . Continue as prescribed.   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 Ambien also not recommended for long term use, however the patient has been on these medications for 20+ years and the  risk of weaning the medication may be too great. Pt voiced understanding of the risks and wishes to continue the medication.    Other Visit Diagnoses       Seborrheic keratosis    -  Primary     Reassured patient that the lesion is benign, we can remove in office if she desires, pt states as long as it is not cancerous she doesn't feel the need to have it removed. Will continue to monitor the lesion periodically.   Meds ordered this encounter  Medications   gabapentin  (NEURONTIN ) 100 MG capsule    Sig: Take 1-3 capsules every night as needed before bed.    Dispense:  90 capsule    Refill:  2   I personally spent a total of 30 minutes in the care of the patient today including getting/reviewing separately obtained history, performing a medically appropriate exam/evaluation, counseling and educating, documenting clinical information in the EHR, and  communicating results.  Return in about 6 months (around 04/05/2025).  Heron CHRISTELLA Sharper, MD

## 2024-10-07 DIAGNOSIS — F331 Major depressive disorder, recurrent, moderate: Secondary | ICD-10-CM | POA: Insufficient documentation

## 2024-10-12 ENCOUNTER — Ambulatory Visit: Attending: Cardiovascular Disease | Admitting: Cardiovascular Disease

## 2024-10-12 ENCOUNTER — Encounter: Payer: Self-pay | Admitting: Cardiovascular Disease

## 2024-10-12 VITALS — BP 136/70 | HR 62 | Ht 69.0 in | Wt 140.0 lb

## 2024-10-12 DIAGNOSIS — E782 Mixed hyperlipidemia: Secondary | ICD-10-CM

## 2024-10-12 DIAGNOSIS — I779 Disorder of arteries and arterioles, unspecified: Secondary | ICD-10-CM | POA: Diagnosis not present

## 2024-10-12 DIAGNOSIS — I341 Nonrheumatic mitral (valve) prolapse: Secondary | ICD-10-CM | POA: Diagnosis not present

## 2024-10-12 NOTE — Assessment & Plan Note (Signed)
 History of mitral valve prolapse with echo performed 08/17/2024 revealing no evidence of prolapse with mild MR otherwise normal LV systolic function.  Her ascending thoracic aorta however measured 40 mm.  We will continue to follow this noninvasively on annual basis.

## 2024-10-12 NOTE — Assessment & Plan Note (Signed)
 History of hyperlipidemia intolerant to statin therapy and Repatha  currently on Nexlizette followed by Dr. Mona.  She does have a mildly elevated coronary calcium score.  Her most recent lipid profile performed 09/07/2024 revealed total cholesterol 191, LDL of 88 and HDL of 96.

## 2024-10-12 NOTE — Patient Instructions (Signed)
 Medication Instructions:  Your physician recommends that you continue on your current medications as directed. Please refer to the Current Medication list given to you today.  *If you need a refill on your cardiac medications before your next appointment, please call your pharmacy*  Testing/Procedures: Your physician has requested that you have an echocardiogram. Echocardiography is a painless test that uses sound waves to create images of your heart. It provides your doctor with information about the size and shape of your heart and how well your heart's chambers and valves are working. This procedure takes approximately one hour. There are no restrictions for this procedure. Please do NOT wear cologne, perfume, aftershave, or lotions (deodorant is allowed). Please arrive 15 minutes prior to your appointment time.  Please note: We ask at that you not bring children with you during ultrasound (echo/ vascular) testing. Due to room size and safety concerns, children are not allowed in the ultrasound rooms during exams. Our front office staff cannot provide observation of children in our lobby area while testing is being conducted. An adult accompanying a patient to their appointment will only be allowed in the ultrasound room at the discretion of the ultrasound technician under special circumstances. We apologize for any inconvenience. **To do in August**   Follow-Up: At [facility], you and your health needs are our priority.  As part of our continuing mission to provide you with exceptional heart care, our providers are all part of one team.  This team includes your primary Cardiologist (physician) and Advanced Practice Providers or APPs (Physician Assistants and Nurse Practitioners) who all work together to provide you with the care you need, when you need it.  Your next appointment:   12 month(s)  Provider:   Dorn Lesches, MD     Other Instructions Need help finding a primary  care provider?  [email or web address]  or  Physician referral line [phone]).

## 2024-10-12 NOTE — Progress Notes (Signed)
 10/12/2024 Tanya Snyder   March 31, 1951  [id number]  Primary Physician [name], MD Primary Cardiologist: Dorn [name] Tanya Snyder, FSCAI  HPI:  Tanya Snyder is a 73 y.o.  thin-appearing divorced Caucasian female mother of 2 children, grandmother of 3 grandchildren who was referred by her PCP, Dr. Heron Sharper, for cardiovascular evaluation because of mitral valve prolapse and atypical chest pain.  I last saw her in the office 11/12/2023.  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 She recently sent for was from [facility], moved to [place]  and ultimately to [place] to be closer to family. Risk factors include treated hypertension and hyperlipidemia. There is no family history of heart disease. She is never had a heart attack or stroke. She does get some shortness of breath and atypical chest pain. 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 She is a breast cancer survivor and has had a mastectomy in the past.    Unfortunately, since I saw her 6 months ago her son has come down with brain cancer at 79 years old and is being treated at [facility].  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  A coronary calcium score performed 4/24 was 35.  She does have mild hyperlipidemia intolerant to statin therapy.  She was tried on Repatha  which she did not tolerate as well as Zetia.  Since I saw her a year ago she has remained stable.  She denies chest pain but does get somewhat short of breath and is seeing a pulmonologist.  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  Current Meds  Medication Sig   albuterol (VENTOLIN HFA) 108 (90 Base) MCG/ACT inhaler SMARTSIG:1-2 Puff(s) Via  Inhaler Every 6 Hours PRN   ALPRAZolam (XANAX) 0.5 MG tablet Take 0.5 mg by mouth at bedtime as needed for anxiety.   amLODipine (NORVASC) 2.5 MG tablet Take 2.5 mg by mouth daily.   ascorbic acid (VITAMIN C) 1000 MG tablet Take 500 mg by mouth daily.   Bempedoic Acid-Ezetimibe (NEXLIZET ) 180-10 MG TABS Take 1 tablet by mouth daily.   calcium carbonate (TUMS - DOSED IN MG ELEMENTAL CALCIUM) 500 MG chewable tablet Chew 1 tablet by mouth daily.   clobetasol  ointment (TEMOVATE ) 0.05 % Apply topically 2 (two) times daily. 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   fluticasone-salmeterol (ADVAIR HFA) 230-21 MCG/ACT inhaler Inhale 2 puffs into the lungs 2 (two) times daily.   gabapentin  (NEURONTIN ) 100 MG capsule Take 1-3 capsules every night as needed before bed.   levothyroxine (SYNTHROID) 125 MCG tablet Take 125 mcg by mouth daily.   Multiple Vitamin (MULTIVITAMIN ADULT PO) Take by mouth.   mupirocin  ointment (BACTROBAN ) 2 % Apply 1 Application topically 2 (two) times daily.   nitrofurantoin , macrocrystal-monohydrate, (MACROBID ) 100 MG capsule Take 1 capsule (100 mg total) by mouth daily.   tiZANidine  (ZANAFLEX ) 4 MG tablet Take 1 tablet (4 mg total) by mouth every 6 (six) hours as needed for muscle spasms.   triamcinolone  (NASACORT ) 55 MCG/ACT AERO nasal inhaler Place into the nose.   triamcinolone  cream (KENALOG ) 0.1 % Apply 1 Application  topically 2 (two) times daily as needed.   zolpidem (AMBIEN) 5 MG tablet Take 5 mg by mouth at bedtime as needed.     Allergies  Allergen Reactions   Lortab [Hydrocodone-Acetaminophen ] Other (See Comments)    Nausea, headache   Statins     CK >3600     Social History   Socioeconomic History   Marital status: Divorced    Spouse name: Not on file   Number of children: Not on file   Years of education: Not on file   Highest education level: Not on file   Occupational History   Not on file  Tobacco Use   Smoking status: Former    Types: Cigarettes   Smokeless tobacco: Never  Vaping Use   Vaping status: Never Used  Substance and Sexual Activity   Alcohol use: No    Alcohol/week: 0.0 standard drinks of alcohol   Drug use: Never   Sexual activity: Not Currently  Other Topics Concern   Not on file  Social History Narrative   Not on file   Social Drivers of Health   Financial Resource Strain: Low Risk  (09/09/2024)   Received [facility]   Overall Financial Resource Strain (CARDIA)    Difficulty of Paying Living Expenses: Not hard at all  Food Insecurity: No Food Insecurity (09/09/2024)   Received from [facility]   Hunger Vital Sign    Within the past 12 months, you worried that your food would run out before you got the money to buy more.: Never true    Within the past 12 months, the food you bought just didn't last and you didn't have money to get more.: Never true  Transportation Needs: No Transportation Needs (09/09/2024)   Received from [facility] - Transportation    In the past 12 months, has lack of transportation kept you from medical appointments or from getting medications?: No    Lack of Transportation (Non-Medical): No  Physical Activity: Sufficiently Active (07/31/2023)   Exercise Vital Sign    Days of Exercise per Week: 3 days    Minutes of Exercise per Session: 60 min  Stress: Stress Concern Present (07/31/2023)   [employer] of Occupational Health - Occupational Stress Questionnaire    Feeling of Stress : To some extent  Social Connections: Moderately Integrated (07/31/2023)   Social Connection and Isolation Panel    Frequency of Communication with Friends and Family: More than three times a week    Frequency of Social Gatherings with Friends and Family: More than three times a week    Attends Religious Services: More than 4 times per year     Active Member of [employer] or Organizations: Yes    Attends Engineer, structural: More than 4 times per year    Marital Status: Divorced  Intimate Partner Violence: Not At Risk (07/31/2023)   Humiliation, Afraid, Rape, and Kick questionnaire    Fear of Current or Ex-Partner: No    Emotionally Abused: No    Physically Abused: No    Sexually Abused: No     Review of Systems: General: negative for chills, fever, night sweats or weight changes.  Cardiovascular: negative for chest pain, dyspnea on exertion, edema, orthopnea, palpitations, paroxysmal nocturnal dyspnea or shortness of breath Dermatological: negative for rash Respiratory: negative for cough or wheezing Urologic: negative for hematuria Abdominal: negative for nausea, vomiting, diarrhea, bright red blood per rectum, melena, or hematemesis  Neurologic: negative for visual changes, syncope, or dizziness All other systems reviewed and are otherwise negative except as noted above.    Blood pressure 136/70, pulse 62, height 5' 9 (1.753 m), weight 140 lb (63.5 kg), SpO2 100%.  General appearance: alert and no distress Neck: no adenopathy, no carotid bruit, no JVD, supple, symmetrical, trachea midline, and thyroid  not enlarged, symmetric, no tenderness/mass/nodules Lungs: clear to auscultation bilaterally Heart: regular rate and rhythm, S1, S2 normal, no murmur, click, rub or gallop Extremities: extremities normal, atraumatic, no cyanosis or edema Pulses: 2+ and symmetric Skin: Skin color, texture, turgor normal. No rashes or lesions Neurologic: Grossly normal  EKG EKG Interpretation Date/Time:  Monday October 12 2024 11:09:32 EDT Ventricular Rate:  62 PR Interval:  144 QRS Duration:  84 QT Interval:  422 QTC Calculation: 428 R Axis:   58  Text Interpretation: Normal sinus rhythm Normal ECG No previous ECGs available Confirmed by Court Carrier [phone]) on 10/12/2024 11:13:09 AM    ASSESSMENT AND PLAN:    Hyperlipidemia History of hyperlipidemia intolerant to statin therapy and Repatha  currently on Nexlizette followed by Dr. Mona.  She does have a mildly elevated coronary calcium score.  Her most recent lipid profile performed 09/07/2024 revealed total cholesterol 191, LDL of 88 and HDL of 96.  Mitral valve prolapse History of mitral valve prolapse with echo performed 08/17/2024 revealing no evidence of prolapse with mild MR otherwise normal LV systolic function.  Her ascending thoracic aorta however measured 40 mm.  We will continue to follow this noninvasively on annual basis.     Carrier DOROTHA Court MD FACP,FACC,FAHA, [facility] 10/12/2024 11:25 AM

## 2024-10-13 ENCOUNTER — Encounter (HOSPITAL_BASED_OUTPATIENT_CLINIC_OR_DEPARTMENT_OTHER): Payer: Self-pay | Admitting: Physical Therapy

## 2024-10-21 NOTE — Therapy (Unsigned)
 OUTPATIENT PHYSICAL THERAPY LOWER EXTREMITY TREATMENT   Patient Name: Tanya Snyder MRN: [id number] DOB:Apr 21, 1951, 73 y.o., female Today's Date: 10/22/2024  END OF SESSION:  PT End of Session - 10/22/24 1144     Visit Number 2    Number of Visits 17    Date for Recertification  11/13/24    Authorization Type [employer] MCR    Authorization Time Period 09/04/24 to 11/13/24    Progress Note Due on Visit 10    PT Start Time 1015    PT Stop Time 1100    PT Time Calculation (min) 45 min    Activity Tolerance Patient tolerated treatment well    Behavior During Therapy [facility] for tasks assessed/performed           Past Medical History:  Diagnosis Date   Asthma    Breast cancer (HCC)    Cancer (HCC)    Depression    Dysrhythmia    History of chicken pox    Hyperlipemia    Hypothyroidism    Scoliosis    Past Surgical History:  Procedure Laterality Date   ABDOMINAL HYSTERECTOMY     CHOLECYSTECTOMY     COLONOSCOPY WITH PROPOFOL  N/A 09/01/2015   Procedure: COLONOSCOPY WITH PROPOFOL ;  Surgeon: Donnice Vaughn Manes, MD;  Location: [facility] ENDOSCOPY;  Service: Endoscopy;  Laterality: N/A;   COLONOSCOPY WITH PROPOFOL  N/A 03/21/2021   Procedure: COLONOSCOPY WITH PROPOFOL ;  Surgeon: [name], MD;  Location: [facility] ENDOSCOPY;  Service: Endoscopy;  Laterality: N/A;   MASTECTOMY     MYRINGOTOMY WITH TUBE PLACEMENT Right 01/24/2022   Procedure: MYRINGOTOMY WITH TUBE PLACEMENT;  Surgeon: [name], MD;  Location: [facility];  Service: ENT;  Laterality: Right;  pt. request later in morning 8 am   nare polypectomy Left    SPINAL FUSION     UPPER GI ENDOSCOPY     Patient Active Problem List   Diagnosis Date Noted   Moderate episode of recurrent major depressive disorder (HCC) 10/07/2024   HX: breast cancer (Double Mastectomy) 07/28/2024   Lichen sclerosus 07/28/2024   Allergic rhinitis due to animal (cat) (dog) hair and dander 08/22/2023   Allergic rhinitis due to pollen  08/22/2023   Generalized pruritus 08/22/2023   Statin myopathy 07/12/2023   Agatston CAC score, <100 06/12/2023   Atypical chest pain 04/30/2023   Cataracts, bilateral 03/29/2023   Dry eye 03/29/2023   Prediabetes 08/14/2022   Pain in joint of right hip 06/08/2021   Numbness in feet 06/05/2021   Imbalance 06/05/2021   Dizziness 06/05/2021   Hx of colonic polyps    Polyp of descending colon    Neuropathy 02/14/2021   Mild persistent asthma, uncomplicated 02/07/2021   Allergic rhinitis 02/07/2021   Palpitations 10/25/2020   Asthma without status asthmaticus 07/05/2020   Depression 07/05/2020   Hyperlipidemia 07/05/2020   Mitral valve prolapse 06/05/2019   Status post right mastectomy 03/18/2019   Bilateral renal masses 08/30/2018   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   History of recurrent UTIs 12/21/2016   Chronic bilateral low back pain without sciatica 08/16/2016   Enthesopathy of right hip 08/07/2016   Mid back pain, chronic 04/29/2015   Personal history of breast cancer 04/29/2015   Malignant neoplasm of right female breast (HCC) 01/07/2015   Lower urinary tract infectious disease 05/24/2014   Pain in thoracic spine 04/14/2012   Idiopathic scoliosis and kyphoscoliosis 04/14/2012   Pain in joint, pelvic region and thigh 12/13/2005   Other specified  acquired hypothyroidism 12/04/2004   Acute sinusitis, unspecified 12/04/2004   Hypoglycemia, unspecified 10/23/2004    PCP: [name] MD   REFERRING PROVIDER: Genelle Standing, MD  REFERRING DIAG: Diagnosis M25.551 (ICD-10-CM) - Pain of right hip (and B shoulder pain free-texted into order in EPIC)  THERAPY DIAG:  Chronic right shoulder pain  Pain in right hip  Muscle weakness (generalized)  Other symptoms and signs involving the musculoskeletal system  Rationale for Evaluation and Treatment: Rehabilitation  ONSET DATE: chronic   SUBJECTIVE:   SUBJECTIVE STATEMENT:  I am  going to [employer] and am only doing my leg exercises, then I get out.   Eval: I'm extremely frustrated from this week, ended up getting a blood draw in [place] where they took 4-5 vials of blood without MD order. I want you to personally tell me about this, thinking about going to small claims court, would like your personal opinion. R shoulder and R hip are the biggest issues, I do have scolisis. R shoulder is burning and has been getting worse with time and its the same idea with the hip. I've tried some PT and one really aggravated my sx and Dr. Donnie who took out some exercises. Other PT didn't impress me. I've been exercising in the pool on my own. Pool is closed now, can you pencil me into water PT? One thing I was told when I was 72 years old was to put a lift in my left shoe but I always thought it should be in my right shoe so I didn't wear it for a long time. Put the lift in my right shoe but it didn't give relief so I put in my left shoe again. Got a lidocane shot from Dr. Genelle Monday, its been helpful but I'm very sure that I have a hip replacement in my future, if I take the wrong step I feel like my hip will disjoint.   PERTINENT HISTORY: See above, additionally from MD notes:  73 y.o. 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  She is overall quite active and is still participating in physical therapy.  She is still having symptoms on the right hip and as well as her shoulders and is hoping to get really a good guided exercise program that will assist her in exercises that she can do without progressing her arthritis or pain.  Will plan for physical therapy referral for this.  I have also offered a right hip injection which she would like to proceed with in September prior to her upcoming trip.  Finally I do believe she may ultimately be a candidate for right hip arthroplasty although we will plan for that referral at the time of  our next injection   PAIN:  Are you having pain? No number given on NPRS at eval  PRECAUTIONS: None  RED FLAGS: None   WEIGHT BEARING RESTRICTIONS: No  FALLS:  Has patient fallen in last 6 months? No  LIVING ENVIRONMENT: Lives with: lives alone   OCCUPATION: retired   PLOF: Independent, Independent with basic ADLs, Independent with gait, and Independent with transfers  PATIENT GOALS: address hip and shoulder pain, find specific exercises that she can keep up with   NEXT MD VISIT: not scheduled with referring   OBJECTIVE:  Note: Objective measures were completed at Evaluation unless otherwise noted.    PATIENT SURVEYS:  PSFS: THE PATIENT SPECIFIC FUNCTIONAL SCALE  Place score of 0-10 (0 =  unable to perform activity and 10 = able to perform activity at the same level as before injury or problem)  Activity Date: 09/04/24    Sitting too long 30 minutes(hip) 2    2. Getting out of car normally (hip) 0    3.  Putting dishes  (shoulder) 1    4. Heavy lifting (shoulder) 8    Total Score 2.75     Note: numerical ratings inferred by PT based off pt answers- no formal numbers given   Total Score = Sum of activity scores/number of activities  Minimally Detectable Change: 3 points (for single activity); 2 points (for average score)  [facility] (nd). The Patient Specific Functional Scale . Retrieved from [email or web address]   COGNITION: Overall cognitive status: Within functional limits for tasks assessed         LOWER EXTREMITY MMT:  MMT Right eval Left eval  Hip flexion 3+ 3+  Hip extension    Hip abduction 4 4+  Hip adduction    Hip internal rotation    Hip external rotation    Knee flexion 4 5  Knee extension 5 5  Ankle dorsiflexion    Ankle plantarflexion    Ankle inversion    Ankle eversion     (Blank rows = not tested)   Shoulder MMT:   Flexion 4+/5 B ABD 4/5 B  IR 4+/5 ER  4+/5    FUNCTIONAL TESTS:  5 times sit to stand: 14.7 seconds no UEs              TUG 9.3 seconds                                                                                                                                              TREATMENT DATE:  10/22/24 Further discussion about goals and current HEP.  - Supine diaphragmatic breathing - Supine TrA contraction - Supine hip iso with TrA contraction and diaphragmatic breathing - Prayer stretch with stretching to each side.     09/04/24  Eval, POC     PATIENT EDUCATION:  Education details: exam findings, POC, water PT and potential benefits, PT to focus on functional strengthening especially in context of possible THA coming up  Person educated: Patient Education method: Medical Illustrator Education comprehension: verbalized understanding and returned demonstration  HOME EXERCISE PROGRAM: Access Code: [email or web address] URL: [email or web address] Date: 10/22/2024 Prepared by: Rojean Batten  Exercises - Supine Diaphragmatic Breathing  - 1 x daily - 7 x weekly - 1-2 sets - 10 reps - 5 hold - Supine Transversus Abdominis Bracing - Hands on Stomach  - 1 x daily - 7 x weekly - 1-2 sets - 10 reps - 5 hold - Supine Hip Adduction Isometric with Ball  - 1 x daily - 7 x weekly - 1-2 sets - 10 reps - Supine March  -  1 x daily - 7 x weekly - 1-2 sets - 10 reps - Child's Pose with Sidebending  - 1 x daily - 7 x weekly - 1-2 sets - 10 reps - Child's Pose Stretch  - 1 x daily - 7 x weekly - 1-2 sets - 10 reps  ASSESSMENT:  CLINICAL IMPRESSION: Pt tolerated session fairly today, she is very fearful of increasing pain with movements. Discussed importance of core foundation for movement with core and diaphragmatic breathing. She would benefit from a review next session prior to adding movement. Pt will continue to benefit from skilled PT to address continued deficits.     Eval: Patient is a 73 y.o. F who was seen  today for physical therapy evaluation and treatment for  shoulder and hip pain. Objectives as above, I think skilled PT services will be most helpful for her in terms of targeted strengthening for her R shoulder and R hip primarily. She perseverated heavily about her frustrations with her blood draw experience earlier this week- education given that this is out of PT scope and this therapist cannot provide professional consult on this situation. She will likely benefit from water PT as well. Will make every effort to improve pain and function moving forward.   OBJECTIVE IMPAIRMENTS: decreased mobility, difficulty walking, decreased strength, and pain.   ACTIVITY LIMITATIONS: carrying, lifting, squatting, stairs, toileting, dressing, self feeding, reach over head, and locomotion level  PARTICIPATION LIMITATIONS: meal prep, cleaning, laundry, driving, shopping, community activity, and yard work  PERSONAL FACTORS: Age, Behavior pattern, Education, Fitness, Past/current experiences, and Time since onset of injury/illness/exacerbation are also affecting patient's functional outcome.   REHAB POTENTIAL: Good  CLINICAL DECISION MAKING: Stable/uncomplicated  EVALUATION COMPLEXITY: Low   GOALS: Goals reviewed with patient? No  SHORT TERM GOALS: Target date: 10/02/2024   Will be compliant with appropriate progressive HEP  Baseline: Goal status: INITIAL  2.  Will complete 5xSTS test in 12 seconds or less to show improved mobility  Baseline:  Goal status: INITIAL  3.  Pain in R shoulder and R hip to be no more than 5/10 at worst  Baseline:  Goal status: INITIAL  4.  Will demonstrate improved awareness of functional posture with all tasks  Baseline:  Goal status: INITIAL    LONG TERM GOALS: Target date: 10/30/2024    MMT to have improved by one grade in all weak groups  Baseline:  Goal status: INITIAL  2.  R shoulder and R hip pain to be no more than 3/10 at worst  Baseline:  Goal  status: INITIAL  3.  Will be able to sit for at least 45 minutes without increased R hip pain  Baseline:  Goal status: INITIAL  4.  Will be able to perform OH tasks and carry groceries with R arm without increased shoulder pain  Baseline:  Goal status: INITIAL  5.  Will be able to exercise as desired without increased R hip/shoulder pain  Baseline:  Goal status: INITIAL  6.  PSFS to have improved by at least 2 points  Baseline:  Goal status: INITIAL   PLAN:  PT FREQUENCY: 2x/week  PT DURATION: 8 weeks  PLANNED INTERVENTIONS: 97750- Physical Performance Testing, 97110-Therapeutic exercises, 97530- Therapeutic activity, [id number]- Neuromuscular re-education, 97535- Self Care, [id number]- Manual therapy, and [id number]- Aquatic Therapy  PLAN FOR NEXT SESSION:   Land: needs HEP from our clinic- she has copies of old HEPS complete with exercises her MDs ruled out, build from this. General focus on  functional progressive strengthening for upper and lower body otherwise  Water: per aquatic therapist discretion     Josette Rough, PT, DPT 10/22/24 11:45 AM   Date of referral: 08/12/24 Referring provider: Dr. Genelle  Referring diagnosis? M25.551 Treatment diagnosis? (if different than referring diagnosis)   M25.551, G89.29, M25.551, M62.81, R29.898  What was this (referring dx) caused by? Ongoing Issue  Lysle of Condition: Chronic (continuous duration > 3 months)   Laterality: Rt  Current Functional Measure Score: Patient Specific Functional Scale 2.75  Objective measurements identify impairments when they are compared to normal values, the uninvolved extremity, and prior level of function.  [x]  Yes  []  No  Objective assessment of functional ability: Minimal functional limitations   Briefly describe symptoms: pain in R hip and R shoulder chronically  How did symptoms start: insidiously   Average pain intensity:  Last 24 hours: R shoulder 7/10, R hip 3/10  Past week: R  shoulder 5/10, R hip 3/10  How often does the pt experience symptoms? Constantly  How much have the symptoms interfered with usual daily activities? Moderately  How has condition changed since care began at this facility? NA - initial visit  In general, how is the patients overall health? Very Good   BACK PAIN (STarT Back Screening Tool) No

## 2024-10-22 ENCOUNTER — Ambulatory Visit (HOSPITAL_BASED_OUTPATIENT_CLINIC_OR_DEPARTMENT_OTHER): Attending: Orthopaedic Surgery | Admitting: Physical Therapy

## 2024-10-22 ENCOUNTER — Encounter (HOSPITAL_BASED_OUTPATIENT_CLINIC_OR_DEPARTMENT_OTHER): Payer: Self-pay | Admitting: Physical Therapy

## 2024-10-22 DIAGNOSIS — R29898 Other symptoms and signs involving the musculoskeletal system: Secondary | ICD-10-CM | POA: Insufficient documentation

## 2024-10-22 DIAGNOSIS — M25551 Pain in right hip: Secondary | ICD-10-CM | POA: Diagnosis present

## 2024-10-22 DIAGNOSIS — M25511 Pain in right shoulder: Secondary | ICD-10-CM | POA: Insufficient documentation

## 2024-10-22 DIAGNOSIS — G8929 Other chronic pain: Secondary | ICD-10-CM | POA: Diagnosis present

## 2024-10-22 DIAGNOSIS — M6281 Muscle weakness (generalized): Secondary | ICD-10-CM | POA: Diagnosis present

## 2024-10-26 ENCOUNTER — Ambulatory Visit: Admitting: Orthopaedic Surgery

## 2024-10-26 ENCOUNTER — Encounter: Payer: Self-pay | Admitting: Radiology

## 2024-10-29 ENCOUNTER — Ambulatory Visit (HOSPITAL_BASED_OUTPATIENT_CLINIC_OR_DEPARTMENT_OTHER): Attending: Orthopaedic Surgery | Admitting: Physical Therapy

## 2024-10-29 ENCOUNTER — Encounter (HOSPITAL_BASED_OUTPATIENT_CLINIC_OR_DEPARTMENT_OTHER): Payer: Self-pay | Admitting: Physical Therapy

## 2024-10-29 DIAGNOSIS — R29898 Other symptoms and signs involving the musculoskeletal system: Secondary | ICD-10-CM | POA: Diagnosis present

## 2024-10-29 DIAGNOSIS — M6281 Muscle weakness (generalized): Secondary | ICD-10-CM | POA: Diagnosis present

## 2024-10-29 DIAGNOSIS — M25551 Pain in right hip: Secondary | ICD-10-CM | POA: Insufficient documentation

## 2024-10-29 DIAGNOSIS — M25511 Pain in right shoulder: Secondary | ICD-10-CM | POA: Insufficient documentation

## 2024-10-29 DIAGNOSIS — G8929 Other chronic pain: Secondary | ICD-10-CM | POA: Insufficient documentation

## 2024-10-29 NOTE — Therapy (Signed)
 OUTPATIENT PHYSICAL THERAPY LOWER EXTREMITY TREATMENT   Patient Name: Tanya Snyder MRN: [id number] DOB:11-Feb-1951, 73 y.o., female Today's Date: 10/29/2024  END OF SESSION:  PT End of Session - 10/29/24 1137     Visit Number 3    Number of Visits 17    Date for Recertification  11/13/24    Authorization Type [employer] MCR    Authorization Time Period 09/04/24 to 11/13/24    Progress Note Due on Visit 10    PT Start Time 1142    PT Stop Time 1233    PT Time Calculation (min) 51 min    Activity Tolerance Patient tolerated treatment well    Behavior During Therapy [facility] for tasks assessed/performed           Past Medical History:  Diagnosis Date   Asthma    Breast cancer (HCC)    Cancer (HCC)    Depression    Dysrhythmia    History of chicken pox    Hyperlipemia    Hypothyroidism    Scoliosis    Past Surgical History:  Procedure Laterality Date   ABDOMINAL HYSTERECTOMY     CHOLECYSTECTOMY     COLONOSCOPY WITH PROPOFOL  N/A 09/01/2015   Procedure: COLONOSCOPY WITH PROPOFOL ;  Surgeon: Donnice Vaughn Manes, MD;  Location: [facility] ENDOSCOPY;  Service: Endoscopy;  Laterality: N/A;   COLONOSCOPY WITH PROPOFOL  N/A 03/21/2021   Procedure: COLONOSCOPY WITH PROPOFOL ;  Surgeon: [name], MD;  Location: [facility] ENDOSCOPY;  Service: Endoscopy;  Laterality: N/A;   MASTECTOMY     MYRINGOTOMY WITH TUBE PLACEMENT Right 01/24/2022   Procedure: MYRINGOTOMY WITH TUBE PLACEMENT;  Surgeon: [name], MD;  Location: [facility];  Service: ENT;  Laterality: Right;  pt. request later in morning 8 am   nare polypectomy Left    SPINAL FUSION     UPPER GI ENDOSCOPY     Patient Active Problem List   Diagnosis Date Noted   Moderate episode of recurrent major depressive disorder (HCC) 10/07/2024   HX: breast cancer (Double Mastectomy) 07/28/2024   Lichen sclerosus 07/28/2024   Allergic rhinitis due to animal (cat) (dog) hair and dander 08/22/2023   Allergic rhinitis due to pollen  08/22/2023   Generalized pruritus 08/22/2023   Statin myopathy 07/12/2023   Agatston CAC score, <100 06/12/2023   Atypical chest pain 04/30/2023   Cataracts, bilateral 03/29/2023   Dry eye 03/29/2023   Prediabetes 08/14/2022   Pain in joint of right hip 06/08/2021   Numbness in feet 06/05/2021   Imbalance 06/05/2021   Dizziness 06/05/2021   Hx of colonic polyps    Polyp of descending colon    Neuropathy 02/14/2021   Mild persistent asthma, uncomplicated 02/07/2021   Allergic rhinitis 02/07/2021   Palpitations 10/25/2020   Asthma without status asthmaticus 07/05/2020   Depression 07/05/2020   Hyperlipidemia 07/05/2020   Mitral valve prolapse 06/05/2019   Status post right mastectomy 03/18/2019   Bilateral renal masses 08/30/2018   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   History of recurrent UTIs 12/21/2016   Chronic bilateral low back pain without sciatica 08/16/2016   Enthesopathy of right hip 08/07/2016   Mid back pain, chronic 04/29/2015   Personal history of breast cancer 04/29/2015   Malignant neoplasm of right female breast (HCC) 01/07/2015   Lower urinary tract infectious disease 05/24/2014   Pain in thoracic spine 04/14/2012   Idiopathic scoliosis and kyphoscoliosis 04/14/2012   Pain in joint, pelvic region and thigh 12/13/2005   Other specified  acquired hypothyroidism 12/04/2004   Acute sinusitis, unspecified 12/04/2004   Hypoglycemia, unspecified 10/23/2004    PCP: [name] MD   REFERRING PROVIDER: Genelle Standing, MD  REFERRING DIAG: Diagnosis M25.551 (ICD-10-CM) - Pain of right hip (and B shoulder pain free-texted into order in EPIC)  THERAPY DIAG:  Chronic right shoulder pain  Pain in right hip  Muscle weakness (generalized)  Other symptoms and signs involving the musculoskeletal system  Rationale for Evaluation and Treatment: Rehabilitation  ONSET DATE: chronic   SUBJECTIVE:   SUBJECTIVE  STATEMENT:  Patient states been doing her HEP. Continues to work out.   Eval: I'm extremely frustrated from this week, ended up getting a blood draw in [place] where they took 4-5 vials of blood without MD order. I want you to personally tell me about this, thinking about going to small claims court, would like your personal opinion. R shoulder and R hip are the biggest issues, I do have scolisis. R shoulder is burning and has been getting worse with time and its the same idea with the hip. I've tried some PT and one really aggravated my sx and Dr. Donnie who took out some exercises. Other PT didn't impress me. I've been exercising in the pool on my own. Pool is closed now, can you pencil me into water PT? One thing I was told when I was 73 years old was to put a lift in my left shoe but I always thought it should be in my right shoe so I didn't wear it for a long time. Put the lift in my right shoe but it didn't give relief so I put in my left shoe again. Got a lidocane shot from Dr. Genelle Monday, its been helpful but I'm very sure that I have a hip replacement in my future, if I take the wrong step I feel like my hip will disjoint.   PERTINENT HISTORY: See above, additionally from MD notes:  73 y.o. 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  She is overall quite active and is still participating in physical therapy.  She is still having symptoms on the right hip and as well as her shoulders and is hoping to get really a good guided exercise program that will assist her in exercises that she can do without progressing her arthritis or pain.  Will plan for physical therapy referral for this.  I have also offered a right hip injection which she would like to proceed with in September prior to her upcoming trip.  Finally I do believe she may ultimately be a candidate for right hip arthroplasty although we will plan for that referral at the time of our  next injection   PAIN:  Are you having pain? No number given on NPRS at eval  PRECAUTIONS: None  RED FLAGS: None   WEIGHT BEARING RESTRICTIONS: No  FALLS:  Has patient fallen in last 6 months? No  LIVING ENVIRONMENT: Lives with: lives alone   OCCUPATION: retired   PLOF: Independent, Independent with basic ADLs, Independent with gait, and Independent with transfers  PATIENT GOALS: address hip and shoulder pain, find specific exercises that she can keep up with   NEXT MD VISIT: not scheduled with referring   OBJECTIVE:  Note: Objective measures were completed at Evaluation unless otherwise noted.    PATIENT SURVEYS:  PSFS: THE PATIENT SPECIFIC FUNCTIONAL SCALE  Place score of 0-10 (0 = unable to perform activity and 10 =  able to perform activity at the same level as before injury or problem)  Activity Date: 09/04/24    Sitting too long 30 minutes(hip) 2    2. Getting out of car normally (hip) 0    3.  Putting dishes  (shoulder) 1    4. Heavy lifting (shoulder) 8    Total Score 2.75     Note: numerical ratings inferred by PT based off pt answers- no formal numbers given   Total Score = Sum of activity scores/number of activities  Minimally Detectable Change: 3 points (for single activity); 2 points (for average score)  [facility] (nd). The Patient Specific Functional Scale . Retrieved from [email or web address]   COGNITION: Overall cognitive status: Within functional limits for tasks assessed         LOWER EXTREMITY MMT:  MMT Right eval Left eval  Hip flexion 3+ 3+  Hip extension    Hip abduction 4 4+  Hip adduction    Hip internal rotation    Hip external rotation    Knee flexion 4 5  Knee extension 5 5  Ankle dorsiflexion    Ankle plantarflexion    Ankle inversion    Ankle eversion     (Blank rows = not tested)   Shoulder MMT:   Flexion 4+/5 B ABD 4/5 B  IR 4+/5 ER  4+/5    FUNCTIONAL TESTS:  5 times sit to stand: 14.7 seconds no UEs              TUG 9.3 seconds                                                                                                                                              TREATMENT DATE:  10/29/24 Supine diaphragmatic breathing Supine TrA contraction Supine SLR  with TRA 2 x 10  Bridge 2 x 10  Standing hip abduction 2 x 10 STS from chair with 2 airex 3 x 10  10/22/24 Further discussion about goals and current HEP.  - Supine diaphragmatic breathing - Supine TrA contraction - Supine hip iso with TrA contraction and diaphragmatic breathing - Prayer stretch with stretching to each side.     09/04/24  Eval, POC     PATIENT EDUCATION:  Education details: exam findings, POC, water PT and potential benefits, PT to focus on functional strengthening especially in context of possible THA coming up  Person educated: Patient Education method: Medical Illustrator Education comprehension: verbalized understanding and returned demonstration  HOME EXERCISE PROGRAM: Access Code: [email or web address] URL: [email or web address] Date: 10/22/2024 Prepared by: Rojean Batten  Exercises - Supine Diaphragmatic Breathing  - 1 x daily - 7 x weekly - 1-2 sets - 10 reps - 5 hold - Supine Transversus Abdominis Bracing - Hands on Stomach  - 1 x daily - 7 x weekly - 1-2 sets - 10 reps -  5 hold - Supine Hip Adduction Isometric with Ball  - 1 x daily - 7 x weekly - 1-2 sets - 10 reps - Supine March  - 1 x daily - 7 x weekly - 1-2 sets - 10 reps - Child's Pose with Sidebending  - 1 x daily - 7 x weekly - 1-2 sets - 10 reps - Child's Pose Stretch  - 1 x daily - 7 x weekly - 1-2 sets - 10 reps  ASSESSMENT:  CLINICAL IMPRESSION: Review of previously completed exercises which are performed with good mechanics. Progressed glute strengthening with cueing for form, positioning, and biomechanics provided throughout as needed.  Educated on rationale throughout session. Patient with tendency to complete more reps than directed, frequent cueing provided for rest breaks. Educated on DOMS and ways to combat soreness. Patient will continue to benefit from physical therapy in order to improve function and reduce impairment.      Eval: Patient is a 73 y.o. F who was seen today for physical therapy evaluation and treatment for  shoulder and hip pain. Objectives as above, I think skilled PT services will be most helpful for her in terms of targeted strengthening for her R shoulder and R hip primarily. She perseverated heavily about her frustrations with her blood draw experience earlier this week- education given that this is out of PT scope and this therapist cannot provide professional consult on this situation. She will likely benefit from water PT as well. Will make every effort to improve pain and function moving forward.   OBJECTIVE IMPAIRMENTS: decreased mobility, difficulty walking, decreased strength, and pain.   ACTIVITY LIMITATIONS: carrying, lifting, squatting, stairs, toileting, dressing, self feeding, reach over head, and locomotion level  PARTICIPATION LIMITATIONS: meal prep, cleaning, laundry, driving, shopping, community activity, and yard work  PERSONAL FACTORS: Age, Behavior pattern, Education, Fitness, Past/current experiences, and Time since onset of injury/illness/exacerbation are also affecting patient's functional outcome.   REHAB POTENTIAL: Good  CLINICAL DECISION MAKING: Stable/uncomplicated  EVALUATION COMPLEXITY: Low   GOALS: Goals reviewed with patient? No  SHORT TERM GOALS: Target date: 10/02/2024   Will be compliant with appropriate progressive HEP  Baseline: Goal status: INITIAL  2.  Will complete 5xSTS test in 12 seconds or less to show improved mobility  Baseline:  Goal status: INITIAL  3.  Pain in R shoulder and R hip to be no more than 5/10 at worst  Baseline:  Goal status:  INITIAL  4.  Will demonstrate improved awareness of functional posture with all tasks  Baseline:  Goal status: INITIAL    LONG TERM GOALS: Target date: 10/30/2024    MMT to have improved by one grade in all weak groups  Baseline:  Goal status: INITIAL  2.  R shoulder and R hip pain to be no more than 3/10 at worst  Baseline:  Goal status: INITIAL  3.  Will be able to sit for at least 45 minutes without increased R hip pain  Baseline:  Goal status: INITIAL  4.  Will be able to perform OH tasks and carry groceries with R arm without increased shoulder pain  Baseline:  Goal status: INITIAL  5.  Will be able to exercise as desired without increased R hip/shoulder pain  Baseline:  Goal status: INITIAL  6.  PSFS to have improved by at least 2 points  Baseline:  Goal status: INITIAL   PLAN:  PT FREQUENCY: 2x/week  PT DURATION: 8 weeks  PLANNED INTERVENTIONS: 97750- Physical Performance Testing, 97110-Therapeutic  exercises, 97530- Therapeutic activity, [id number]- Neuromuscular re-education, [phone]- Self Care, 02859- Manual therapy, and [phone]- Aquatic Therapy  PLAN FOR NEXT SESSION:   Land: needs HEP from our clinic- she has copies of old HEPS complete with exercises her MDs ruled out, build from this. General focus on functional progressive strengthening for upper and lower body otherwise  Water: per aquatic therapist discretion      Prentice GORMAN Stains, PT, DPT 10/29/2024, 12:46 PM    Date of referral: 08/12/24 Referring provider: Dr. Genelle  Referring diagnosis? M25.551 Treatment diagnosis? (if different than referring diagnosis)   M25.551, G89.29, M25.551, M62.81, R29.898  What was this (referring dx) caused by? Ongoing Issue  Lysle of Condition: Chronic (continuous duration > 3 months)   Laterality: Rt  Current Functional Measure Score: Patient Specific Functional Scale 2.75  Objective measurements identify impairments when they are compared to normal  values, the uninvolved extremity, and prior level of function.  [x]  Yes  []  No  Objective assessment of functional ability: Minimal functional limitations   Briefly describe symptoms: pain in R hip and R shoulder chronically  How did symptoms start: insidiously   Average pain intensity:  Last 24 hours: R shoulder 7/10, R hip 3/10  Past week: R shoulder 5/10, R hip 3/10  How often does the pt experience symptoms? Constantly  How much have the symptoms interfered with usual daily activities? Moderately  How has condition changed since care began at this facility? NA - initial visit  In general, how is the patients overall health? Very Good   BACK PAIN (STarT Back Screening Tool) No

## 2024-11-02 ENCOUNTER — Ambulatory Visit: Admitting: Physician Assistant

## 2024-11-02 ENCOUNTER — Encounter: Payer: Self-pay | Admitting: Physician Assistant

## 2024-11-02 ENCOUNTER — Other Ambulatory Visit (INDEPENDENT_AMBULATORY_CARE_PROVIDER_SITE_OTHER)

## 2024-11-02 VITALS — BP 104/64 | HR 80 | Ht 68.0 in | Wt 137.4 lb

## 2024-11-02 DIAGNOSIS — R634 Abnormal weight loss: Secondary | ICD-10-CM

## 2024-11-02 DIAGNOSIS — R1084 Generalized abdominal pain: Secondary | ICD-10-CM

## 2024-11-02 DIAGNOSIS — R198 Other specified symptoms and signs involving the digestive system and abdomen: Secondary | ICD-10-CM | POA: Diagnosis not present

## 2024-11-02 DIAGNOSIS — Z853 Personal history of malignant neoplasm of breast: Secondary | ICD-10-CM | POA: Diagnosis not present

## 2024-11-02 LAB — COMPREHENSIVE METABOLIC PANEL WITH GFR
ALT: 18 U/L (ref 0–35)
AST: 22 U/L (ref 0–37)
Albumin: 4.6 g/dL (ref 3.5–5.2)
Alkaline Phosphatase: 48 U/L (ref 39–117)
BUN: 18 mg/dL (ref 6–23)
CO2: 28 meq/L (ref 19–32)
Calcium: 9.3 mg/dL (ref 8.4–10.5)
Chloride: 102 meq/L (ref 96–112)
Creatinine, Ser: 0.68 mg/dL (ref 0.40–1.20)
GFR: 85.97 mL/min (ref >60.00)
Glucose, Bld: 100 mg/dL — ABNORMAL HIGH (ref 70–99)
Potassium: 5.3 meq/L — ABNORMAL HIGH (ref 3.5–5.1)
Sodium: 137 meq/L (ref 135–145)
Total Bilirubin: 0.4 mg/dL (ref 0.2–1.2)
Total Protein: 6.9 g/dL (ref 6.0–8.3)

## 2024-11-02 LAB — CBC WITH DIFFERENTIAL/PLATELET
Basophils Absolute: 0 K/uL (ref 0.0–0.1)
Basophils Relative: 1 % (ref 0.0–3.0)
Eosinophils Absolute: 0.1 K/uL (ref 0.0–0.7)
Eosinophils Relative: 1.6 % (ref 0.0–5.0)
HCT: 37 % (ref 36.0–46.0)
Hemoglobin: 12.6 g/dL (ref 12.0–15.0)
Lymphocytes Relative: 22.7 % (ref 12.0–46.0)
Lymphs Abs: 1 K/uL (ref 0.7–4.0)
MCHC: 34.1 g/dL (ref 30.0–36.0)
MCV: 92.2 fl (ref 78.0–100.0)
Monocytes Absolute: 0.5 K/uL (ref 0.1–1.0)
Monocytes Relative: 10.2 % (ref 3.0–12.0)
Neutro Abs: 2.9 K/uL (ref 1.4–7.7)
Neutrophils Relative %: 64.5 % (ref 43.0–77.0)
Platelets: 321 K/uL (ref 150.0–400.0)
RBC: 4.01 Mil/uL (ref 3.87–5.11)
RDW: 13.3 % (ref 11.5–15.5)
WBC: 4.5 K/uL (ref 4.0–10.5)

## 2024-11-02 LAB — LIPASE: Lipase: 26 U/L (ref 11.0–59.0)

## 2024-11-02 NOTE — Patient Instructions (Addendum)
 Your provider has requested that you go to the basement level for lab work before leaving today. Press B on the elevator. The lab is located at the first door on the left as you exit the elevator.  Please try samples of Restora Probiotic 1 capsule once daily. If that does not help, then you can try Align Probiotic 1 capsule once daily.  You have been scheduled for an appointment with Ellouise Console on 12-22-24 at 10:40am . Please arrive 10 minutes early for your appointment.  You have been scheduled for a CT scan of the abdomen and pelvis at [facility] ([address] [place], [place], [place] [address]).   You are scheduled on 11-12-24 at 2pm. You should arrive by 12pm your appointment time for registration. Please follow the written instructions below on the day of your exam:  WARNING: IF YOU ARE ALLERGIC TO IODINE/X-RAY DYE, PLEASE NOTIFY RADIOLOGY IMMEDIATELY AT [phone]! YOU WILL BE GIVEN A 13 HOUR PREMEDICATION PREP.  1) Do not eat  after 12pm (4 hours prior to your test) 2) You will be given 2 bottles of oral contrast to drink on site.    Drink 1 bottle of contrast @ 12pm (2 hours prior to your exam)  Drink 1 bottle of contrast @  1pm (1 hour prior to your exam)  You may take any medications as prescribed with a small amount of water, if necessary. If you take any of the following medications: METFORMIN, GLUCOPHAGE, GLUCOVANCE, AVANDAMET, RIOMET, FORTAMET, ACTOPLUS MET, JANUMET, GLUMETZA or METAGLIP, you MAY be asked to HOLD this medication 48 hours AFTER the exam.  The purpose of you drinking the oral contrast is to aid in the visualization of your intestinal tract. The contrast solution may cause some diarrhea. Depending on your individual set of symptoms, you may also receive an intravenous injection of x-ray contrast/dye. Plan on being at [facility] for 30 minutes or longer, depending on the type of exam you are having performed.  This test typically takes 30-45  minutes to complete.  If you have any questions regarding your exam or if you need to reschedule, you may call the CT department at [phone] between the hours of 8:00 am and 5:00 pm, Monday-Friday.  Thank you,

## 2024-11-02 NOTE — Progress Notes (Signed)
 Tanya Console, PA-C [address] [place], [place]  [address] Phone: [phone]   Gastroenterology Consultation  Referring Provider:     [name], MD Primary Care Physician:  [name], MD Primary Gastroenterologist:  Tanya Console, PA-C / Glendia Holt, MD  Reason for Consultation:     Abdominal pain, weight loss, irregular bowel habits        HPI:   Discussed the use of AI scribe software for clinical note transcription with the patient, who gave verbal consent to proceed.  New patient, here to evaluate generalized abdominal pain, weight loss, and irregular bowel habits.  Tanya Snyder has history of constipation and hemorrhoids.  Previously treated with MiraLAX and fiber supplement.  Previous patient of Dr. Jinny and Dr. Unk at [facility] in [place].  Transferring care to our office.  History of adenomatous colon polyps. History of Present Illness Since March 2025, Tanya Snyder has experienced a change in bowel habits, transitioning from constipation to loose bowel movements occurring three to four times daily. The stools are described as 'mushy' with no blood present. Tanya Snyder has discontinued Metamucil and other constipation medications.  Over the past eight months, Tanya Snyder has experienced a 20-pound weight loss, which Tanya Snyder attributes to dietary changes. Tanya Snyder has been avoiding carbohydrates and documenting foods that alleviate or exacerbate Tanya Snyder symptoms. Foods that help include club soda, seltzer, a gluten-free and lactose-free diet, and proteins like tuna, salmon, and steak. Exercise, stomach massage, and a heating pad provide relief for abdominal cramps.  Tanya Snyder reports symptoms of stomach ache, loud bowel sounds, nausea after eating, and frequent bowel movements. Tanya Snyder reports abdominal cramping, which Tanya Snyder describes as being on Tanya Snyder left side and higher up. Tanya Snyder has a history of scoliosis with rods implanted, which Tanya Snyder believes may contribute to Tanya Snyder abdominal symptoms.  Tanya Snyder is also  on prophylactic nitrofurantoin  for chronic UTIs. Meclizine is taken for nausea and Gas-X for gas relief.  Tanya Snyder family history is significant for Tanya Snyder son being diagnosed with brain cancer 18 months ago, and Tanya Snyder daughter having bipolar disorder. Tanya Snyder has experienced significant stress related to Tanya Snyder children's health issues and Tanya Snyder own medical history, including a double mastectomy for breast cancer.  02/2021 colonoscopy done by Dr. Jinny: 1 small 5 mm sessile serrated polyp removed.  Grade 1 nonbleeding internal hemorrhoids.  Excellent prep.  7-year repeat (due 02/2028).  09/01/2015 colonoscopy by Dr. Jeri for personal history of colon polyp - Non-thrombosed external hemorrhoids found on perianal exam. - The entire examined colon is normal on direct and retroflexion views. - No specimens collected.  06/2018 complete abdominal ultrasound: Prior cholecystectomy.  Common bile duct 7 mm.  Normal liver and pancreas.  Bilateral kidney cysts.  No repeat abdominal imaging since 2019.    11/08/2020 03/21/2021 01/24/2022  Weight /BMI     Weight 157 lb 8 oz  154 lb  154 lb   Height  5' 9 (1.753 m)  5' 7 (1.702 m)   BMI 23.26 kg/m2  22.74 kg/m2  24.12 kg/m2     03/27/2024 07/08/2024 07/30/2024  Weight /BMI     Weight 145 lb 12.8 oz  144 lb  142 lb   Height 5' 8 (1.727 m)  5' 8 (1.727 m)  5' 7.5 (1.715 m)   BMI 22.17 kg/m2  21.9 kg/m2  21.91 kg/m2     10/05/2024 10/12/2024 11/02/2024  Weight /BMI     Weight 135 lb 12.8 oz  140 lb  137 lb 6  oz   Height 5' 7.5 (1.715 m)  5' 9 (1.753 m)  5' 8 (1.727 m)   BMI 20.96 kg/m2  20.67 kg/m2  20.89 kg/m2      Past Medical History:  Diagnosis Date   Asthma    Breast cancer (HCC)    Cancer (HCC)    Depression    Dysrhythmia    History of chicken pox    Hyperlipemia    Hypothyroidism    Scoliosis     Past Surgical History:  Procedure Laterality Date   ABDOMINAL HYSTERECTOMY     CHOLECYSTECTOMY     COLONOSCOPY WITH PROPOFOL  N/A 09/01/2015   Procedure:  COLONOSCOPY WITH PROPOFOL ;  Surgeon: Donnice Vaughn Manes, MD;  Location: [facility] ENDOSCOPY;  Service: Endoscopy;  Laterality: N/A;   COLONOSCOPY WITH PROPOFOL  N/A 03/21/2021   Procedure: COLONOSCOPY WITH PROPOFOL ;  Surgeon: [name], MD;  Location: [facility] ENDOSCOPY;  Service: Endoscopy;  Laterality: N/A;   MASTECTOMY     MYRINGOTOMY WITH TUBE PLACEMENT Right 01/24/2022   Procedure: MYRINGOTOMY WITH TUBE PLACEMENT;  Surgeon: [name], MD;  Location: [facility];  Service: ENT;  Laterality: Right;  pt. request later in morning 8 am   nare polypectomy Left    SPINAL FUSION     UPPER GI ENDOSCOPY      Prior to Admission medications   Medication Sig Start Date End Date Taking? Authorizing Provider  albuterol (VENTOLIN HFA) 108 (90 Base) MCG/ACT inhaler SMARTSIG:1-2 Puff(s) Via Inhaler Every 6 Hours PRN 01/15/20   [provider]  amLODipine (NORVASC) 2.5 MG tablet Take 2.5 mg by mouth daily. 02/13/23 10/12/24  [provider]  ascorbic acid (VITAMIN C) 1000 MG tablet Take 500 mg by mouth daily.    [provider]  Bempedoic Acid-Ezetimibe (NEXLIZET ) 180-10 MG TABS Take 1 tablet by mouth daily. 04/01/24   Hilty, Vinie BROCKS, MD  calcium carbonate (TUMS - DOSED IN MG ELEMENTAL CALCIUM) 500 MG chewable tablet Chew 1 tablet by mouth daily.    [provider]  clobetasol  ointment (TEMOVATE ) 0.05 % Apply topically 2 (two) times daily. Apply twice a day when Lichen Sclerosus flares up 07/28/24   Lo, Donna K, CNM  cyclobenzaprine (FLEXERIL) 5 MG tablet Take 5 mg by mouth at bedtime as needed. 05/10/20   [provider]  cycloSPORINE  (RESTASIS ) 0.05 % ophthalmic emulsion Place 1 drop into both eyes 2 (two) times daily. Patient not taking: Reported on 10/12/2024 12/10/23   Ozell Heron HERO, MD  fluticasone-salmeterol (ADVAIR HFA) 230-21 MCG/ACT inhaler Inhale 2 puffs into the lungs 2 (two) times daily.    [provider]  gabapentin  (NEURONTIN ) 100  MG capsule Take 1-3 capsules every night as needed before bed. 10/05/24   Ozell Heron HERO, MD  levothyroxine (SYNTHROID) 125 MCG tablet Take 125 mcg by mouth daily. 03/29/20   [provider]  Multiple Vitamin (MULTIVITAMIN ADULT PO) Take by mouth.    [provider]  mupirocin  ointment (BACTROBAN ) 2 % Apply 1 Application topically 2 (two) times daily. 12/10/23   Ozell Heron HERO, MD  NEOMYCIN -POLYMYXIN-HYDROCORTISONE (CORTISPORIN) 1 % SOLN OTIC solution Apply 1-2 drops to toe BID after soaking Patient not taking: Reported on 10/12/2024 03/31/24   Hyatt, Max T, DPM  nitrofurantoin , macrocrystal-monohydrate, (MACROBID ) 100 MG capsule Take 1 capsule (100 mg total) by mouth daily. 08/31/24   Lo, Arland POUR, CNM  tiZANidine  (ZANAFLEX ) 4 MG tablet Take 1 tablet (4 mg total) by mouth every 6 (six) hours as needed  for muscle spasms. 08/12/24   Genelle Standing, MD  triamcinolone  (NASACORT ) 55 MCG/ACT AERO nasal inhaler Place into the nose.    [provider]  triamcinolone  cream (KENALOG ) 0.1 % Apply 1 Application topically 2 (two) times daily as needed. 07/28/24   Lo, Donna K, CNM  zolpidem (AMBIEN) 5 MG tablet Take 5 mg by mouth at bedtime as needed. 06/14/20   [provider]    Family History  Problem Relation Age of Onset   Multiple sclerosis Mother    Prostate cancer Maternal Uncle    Colon cancer Maternal Uncle    Colon cancer Maternal Grandmother      Social History   Tobacco Use   Smoking status: Former    Types: Cigarettes   Smokeless tobacco: Never  Vaping Use   Vaping status: Never Used  Substance Use Topics   Alcohol use: No    Alcohol/week: 0.0 standard drinks of alcohol   Drug use: Never    Allergies as of 11/02/2024 - Review Complete 11/02/2024  Allergen Reaction Noted   Lortab [hydrocodone-acetaminophen ] Other (See Comments) 11/30/2019   Statins  06/11/2023    Review of Systems:    All systems reviewed and negative except where noted in  HPI.   Physical Exam:  BP 104/64   Pulse 80   Ht 5' 8 (1.727 m)   Wt 137 lb 6 oz (62.3 kg)   BMI 20.89 kg/m  No LMP recorded. Patient has had a hysterectomy.  General:   Alert,  Well-developed, very tall and thin female,  pleasant and cooperative in NAD. Lungs:  Respirations even and unlabored.  Clear throughout to auscultation.   No wheezes, crackles, or rhonchi. No acute distress. Heart:  Regular rate and rhythm; no murmurs, clicks, rubs, or gallops. Abdomen:  Normal bowel sounds.  No bruits.  Soft, mild lower abdominal distention.    No hepatosplenomegaly or hernias noted.  Moderate generalized tenderness throughout the upper and lower abdomen diffusely.  No guarding or rebound tenderness.    Neurologic:  Alert and oriented x3;  grossly normal neurologically. Psych:  Alert and cooperative. Normal mood and affect. Back: Severe Scoliosis.   Imaging Studies: No results found.  Labs: CBC    Component Value Date/Time   WBC 3.7 (L) 07/31/2023 0845   RBC 4.28 07/31/2023 0845   HGB 13.3 07/31/2023 0845   HGB 13.7 01/03/2015 1155   HCT 39.9 07/31/2023 0845   HCT 41.2 01/03/2015 1155   PLT 312.0 07/31/2023 0845   PLT 272 01/03/2015 1155   MCV 93.2 07/31/2023 0845   MCV 93 01/03/2015 1155    CMP     Component Value Date/Time   NA 133 (L) 09/07/2024 0835   NA 134 (L) 01/03/2015 1155   K 4.8 09/07/2024 0835   K 3.7 01/03/2015 1155   CL 97 09/07/2024 0835   CL 101 01/03/2015 1155   CO2 23 09/07/2024 0835   CO2 27 01/03/2015 1155   GLUCOSE 93 09/07/2024 0835   GLUCOSE 91 07/31/2023 0845   GLUCOSE 86 01/03/2015 1155   BUN 19 09/07/2024 0835   BUN 11 01/03/2015 1155   CREATININE 0.71 09/07/2024 0835   CREATININE 0.64 01/03/2015 1155   CALCIUM 9.8 09/07/2024 0835   CALCIUM 8.5 01/03/2015 1155   PROT 6.7 09/07/2024 0835   PROT 7.6 01/03/2015 1155   ALBUMIN 4.9 (H) 09/07/2024 0835   ALBUMIN 4.3 01/03/2015 1155   AST 22 09/07/2024 0835   AST 34 01/03/2015 1155  ALT 22  09/07/2024 0835   ALT 23 01/03/2015 1155   ALKPHOS 60 09/07/2024 0835   ALKPHOS 66 01/03/2015 1155   BILITOT 0.5 09/07/2024 0835   BILITOT 0.3 01/03/2015 1155   GFRNONAA >60 01/03/2015 1155   GFRNONAA >60 12/28/2013 1000   GFRAA >60 01/03/2015 1155   GFRAA >60 12/28/2013 1000    Assessment and Plan:   RUBY LOGIUDICE is a 73 y.o. y/o female has been referred for   Generalized abdominal Pain: Uncertain etiology Constipation alternating with Diarrhea: Query IBS? Unintentional weight loss: Evaluate for intra-abdominal neoplasm History of Breast Cancer  Plan: - Labs: CBC, CMP, Lipase, Celiac screen - CT abd / pelvis with contrast - Restora Probiotic 1 capsule daily, samples given. - Decide further treatment based on above test results  Follow up in 6 weeks or sooner if symptoms worsen.  Also follow-up based on above test results.  Tanya Console, PA-C

## 2024-11-03 ENCOUNTER — Ambulatory Visit: Payer: Self-pay | Admitting: Physician Assistant

## 2024-11-03 ENCOUNTER — Encounter (HOSPITAL_BASED_OUTPATIENT_CLINIC_OR_DEPARTMENT_OTHER): Admitting: Physical Therapy

## 2024-11-03 DIAGNOSIS — E875 Hyperkalemia: Secondary | ICD-10-CM

## 2024-11-04 LAB — CELIAC DISEASE AB SCREEN W/RFX
Deamidated Gliadin Abs, IgA: 3 U (ref 0–19)
Immunoglobulin A, (IgA) QN, Serum: 117 mg/dL (ref 64–422)
t-Transglutaminase (tTG) IgA: <2 U/mL (ref 0–3)

## 2024-11-05 ENCOUNTER — Encounter (HOSPITAL_BASED_OUTPATIENT_CLINIC_OR_DEPARTMENT_OTHER): Admitting: Physical Therapy

## 2024-11-10 ENCOUNTER — Ambulatory Visit: Admitting: Allergy & Immunology

## 2024-11-11 ENCOUNTER — Ambulatory Visit: Admitting: Orthopaedic Surgery

## 2024-11-11 DIAGNOSIS — M25551 Pain in right hip: Secondary | ICD-10-CM | POA: Diagnosis not present

## 2024-11-11 DIAGNOSIS — M1611 Unilateral primary osteoarthritis, right hip: Secondary | ICD-10-CM | POA: Insufficient documentation

## 2024-11-11 NOTE — Progress Notes (Signed)
 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  She has had an intra-articular injection under ultrasound by my partner Dr. Genelle around the end of September of this year and that has helped.  She has had at least 2 other injections under fluoroscopy over the years.  She does have significant scoliosis and that is what bothers her more.  She had the most recent injection in order to get her through her trip that involves a lot of walking.  She said the pain is not severe and is not daily and most the time injections lasted very long amount of time.  She just really wants to discuss hip replacement surgery today and to be tied in with someone who does perform hip replacement surgery.  On exam her right hip moves smoothly and fluidly today with no blocks to rotation and no significant discomfort at all.  Her left hip also moves smoothly.  X-rays were reviewed with her today from previous and it does show the significance of her scoliosis with lumbar spine at the lower aspect of the right hip space is narrowed compared to left hip space in the superior lateral femoral head is slightly flattened.  Certainly she should still pursue nonoperative conservative treatment since she is not having severe pain on a daily basis.  She can always have a repeat injection between 5 and 6 months apart and we can have that even done upstairs with Dr. Eldonna under fluoroscopy if she would like to try that versus Dr. Burnetta or even Dr. Genelle.  I did give her handout about anterior hip replacement surgery and described what this involves.  She is welcome to reach out to us  at any time.  All questions and concerns were addressed and answered.

## 2024-11-11 NOTE — Progress Notes (Signed)
 Agree with the assessment and plan as outlined by Ellouise Console, PA-C.  Low suspicion for malignancy as cause of weight loss, but agree with CT.  Symptoms seem most consistent with IBS, but weight loss certainly warrants additional work up.  Reassuring that weight loss seems to have plateaued.

## 2024-11-12 ENCOUNTER — Ambulatory Visit (HOSPITAL_COMMUNITY)
Admission: RE | Admit: 2024-11-12 | Discharge: 2024-11-12 | Disposition: A | Discharge status: Home / Self Care | Source: Ambulatory Visit | Attending: Physician Assistant | Admitting: Physician Assistant

## 2024-11-12 DIAGNOSIS — Z853 Personal history of malignant neoplasm of breast: Secondary | ICD-10-CM | POA: Insufficient documentation

## 2024-11-12 DIAGNOSIS — R634 Abnormal weight loss: Secondary | ICD-10-CM | POA: Insufficient documentation

## 2024-11-12 DIAGNOSIS — R1084 Generalized abdominal pain: Secondary | ICD-10-CM | POA: Insufficient documentation

## 2024-11-12 MED ORDER — IOHEXOL 300 MG/ML  SOLN
100.0000 mL | Freq: Once | INTRAMUSCULAR | Status: AC | PRN
  Administered 2024-11-12: 100 mL via INTRAVENOUS

## 2024-11-12 MED ORDER — SODIUM CHLORIDE (PF) 0.9 % IJ SOLN
INTRAMUSCULAR | Status: AC
  Filled 2024-11-12: qty 50

## 2024-11-12 MED ORDER — IOHEXOL 9 MG/ML PO SOLN
ORAL | Status: AC
  Filled 2024-11-12: qty 1000

## 2024-11-12 MED ORDER — IOHEXOL 9 MG/ML PO SOLN
500.0000 mL | ORAL | Status: AC
  Administered 2024-11-12 (×2): 500 mL via ORAL

## 2024-11-24 ENCOUNTER — Encounter (HOSPITAL_BASED_OUTPATIENT_CLINIC_OR_DEPARTMENT_OTHER): Admitting: Physical Therapy

## 2024-11-26 ENCOUNTER — Encounter (HOSPITAL_BASED_OUTPATIENT_CLINIC_OR_DEPARTMENT_OTHER): Admitting: Physical Therapy

## 2024-11-30 ENCOUNTER — Other Ambulatory Visit (HOSPITAL_BASED_OUTPATIENT_CLINIC_OR_DEPARTMENT_OTHER): Payer: Self-pay

## 2024-12-01 ENCOUNTER — Encounter (HOSPITAL_BASED_OUTPATIENT_CLINIC_OR_DEPARTMENT_OTHER): Admitting: Physical Therapy

## 2024-12-03 ENCOUNTER — Encounter (HOSPITAL_BASED_OUTPATIENT_CLINIC_OR_DEPARTMENT_OTHER): Admitting: Physical Therapy

## 2024-12-08 ENCOUNTER — Encounter (HOSPITAL_BASED_OUTPATIENT_CLINIC_OR_DEPARTMENT_OTHER): Admitting: Physical Therapy

## 2024-12-10 ENCOUNTER — Encounter (HOSPITAL_BASED_OUTPATIENT_CLINIC_OR_DEPARTMENT_OTHER): Admitting: Physical Therapy

## 2024-12-14 ENCOUNTER — Encounter (HOSPITAL_BASED_OUTPATIENT_CLINIC_OR_DEPARTMENT_OTHER): Admitting: Physical Therapy

## 2024-12-22 ENCOUNTER — Encounter (HOSPITAL_BASED_OUTPATIENT_CLINIC_OR_DEPARTMENT_OTHER): Admitting: Physical Therapy

## 2024-12-22 ENCOUNTER — Ambulatory Visit: Admitting: Physician Assistant

## 2024-12-23 ENCOUNTER — Encounter (HOSPITAL_BASED_OUTPATIENT_CLINIC_OR_DEPARTMENT_OTHER): Admitting: Physical Therapy

## 2024-12-29 ENCOUNTER — Other Ambulatory Visit: Payer: Self-pay | Admitting: *Deleted

## 2024-12-29 ENCOUNTER — Encounter: Payer: Self-pay | Admitting: Allergy & Immunology

## 2024-12-29 ENCOUNTER — Ambulatory Visit: Admitting: Allergy & Immunology

## 2024-12-29 ENCOUNTER — Telehealth: Payer: Self-pay | Admitting: Allergy & Immunology

## 2024-12-29 ENCOUNTER — Other Ambulatory Visit: Payer: Self-pay

## 2024-12-29 ENCOUNTER — Telehealth: Payer: Self-pay

## 2024-12-29 VITALS — HR 75 | Temp 98.3°F | Wt 140.0 lb

## 2024-12-29 DIAGNOSIS — M2550 Pain in unspecified joint: Secondary | ICD-10-CM

## 2024-12-29 DIAGNOSIS — J4530 Mild persistent asthma, uncomplicated: Secondary | ICD-10-CM

## 2024-12-29 DIAGNOSIS — E876 Hypokalemia: Secondary | ICD-10-CM

## 2024-12-29 DIAGNOSIS — J310 Chronic rhinitis: Secondary | ICD-10-CM | POA: Diagnosis not present

## 2024-12-29 MED ORDER — ADVAIR HFA 230-21 MCG/ACT IN AERO: 2.0000 | INHALATION_SPRAY | Freq: Two times a day (BID) | RESPIRATORY_TRACT | 5 refills | Status: AC

## 2024-12-29 MED ORDER — MUPIROCIN 2 % EX OINT: 1.0000 | TOPICAL_OINTMENT | Freq: Two times a day (BID) | CUTANEOUS | 0 refills | Status: AC

## 2024-12-29 MED ORDER — ALBUTEROL SULFATE HFA 108 (90 BASE) MCG/ACT IN AERS: 2.0000 | INHALATION_SPRAY | RESPIRATORY_TRACT | 1 refills | Status: AC | PRN

## 2024-12-29 NOTE — Telephone Encounter (Signed)
 Patient needs a prior authorization for her rescue and daily inhalers: Advair  115-21 MCG/ACT and albuterol  108 MCG/ACT.

## 2024-12-29 NOTE — Telephone Encounter (Signed)
 Pt wanted to know if her medications needed a reauthorization before she went to the pharmacy to pick them up, she's referring to the albuterol  (VENTOLIN  HFA) 108 (90 Base) MCG/ACT inhaler [[id number]], and fluticasone-salmeterol (ADVAIR  HFA) 115-21 MCG/ACT inhaler [[id number]]

## 2024-12-29 NOTE — Patient Instructions (Addendum)
 1. Mild persistent asthma, uncomplicated - Lung testing looked fairly stable today.  - We are getting some lab work to look for serious causes of difficult to control asthma.  - We may need to an injectable medication to help with your breathing.  - We will be in touch once the testing returns.  - Spacer use reviewed. - Daily controller medication(s): Advair  230/68mcg two puffs TWICE daily with spacer - Prior to physical activity: albuterol  2 puffs 10-15 minutes before physical activity. - Rescue medications: albuterol  4 puffs every 4-6 hours as needed - Changes during respiratory infections or worsening symptoms: Increase Advair  to 2 puffs twice daily for TWO WEEKS. - Asthma control goals:  * Full participation in all desired activities (may need albuterol  before activity) * Albuterol  use two time or less a week on average (not counting use with activity) * Cough interfering with sleep two time or less a month * Oral steroids no more than once a year * No hospitalizations  2. Chronic rhinitis - We are holding off on allergy testing since you do not want allergy shots anyway.  - Avoid diphenhydramine -containing medications since these cross into the brain most readily. - We are going to start nasal ipratropium one spray per nostril up to three times daily to keep your postnasal drip under better control.   3. Joint pain - We are going to get some blood work to look for markers of arthritis (ANA, CRP, ESR, RF).  4. Return in about 3 months (around 03/29/2025) for ALLERGY TESTING (1-55). You can have the follow up appointment with Dr. Iva or a Nurse Practicioner (our Nurse Practitioners are excellent and always have Physician oversight!).    Please inform us  of any Emergency Department visits, hospitalizations, or changes in symptoms. Call us  before going to the ED for breathing or allergy symptoms since we might be able to fit you in for a sick visit. Feel free to contact us  anytime  with any questions, problems, or concerns.  It was a pleasure to see you again today!  Websites that have reliable patient information: 1. American Academy of Asthma, Allergy, and Immunology: www.aaaai.org 2. Food Allergy Research and Education (FARE): foodallergy.org 3. Mothers of Asthmatics: http://www.asthmacommunitynetwork.org 4. American College of Allergy, Asthma, and Immunology: www.acaai.org      Like us  on [employer] and Instagram for our latest updates!      A healthy democracy works best when [employer] participate! Make sure you are registered to vote! If you have moved or changed any of your contact information, you will need to get this updated before voting! Scan the QR codes below to learn more!

## 2024-12-29 NOTE — Progress Notes (Unsigned)
 "  FOLLOW UP  Date of Service/Encounter:  12/29/2024   Assessment:   Mild persistent asthma, uncomplicated   Restrictive pattern on spirometry, likely due to history of severe scoliosis.   Chronic rhinitis - planning for skin testing in the future  Weight loss - follows with Ellouise Console     Plan/Recommendations:   There are no Patient Instructions on file for this visit.   Subjective:   Tanya Snyder is a 74 y.o. female presenting today for follow up of  Chief Complaint  Patient presents with   Follow-up    Pt here for follow up. Work outs causing pt being able to not catch breath even using rescue inhaler before.    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   Agatston CAC score, <100 06/12/2023   Atypical chest pain 04/30/2023   Cataracts, bilateral 03/29/2023   Dry eye 03/29/2023   Prediabetes 08/14/2022   Pain in joint of right hip 06/08/2021   Numbness in feet 06/05/2021   Imbalance 06/05/2021   Dizziness 06/05/2021   Hx of colonic polyps    Polyp of descending colon    Neuropathy 02/14/2021   Mild persistent asthma, uncomplicated 02/07/2021   Allergic rhinitis 02/07/2021   Palpitations 10/25/2020   Asthma without status asthmaticus 07/05/2020   Depression 07/05/2020   Hyperlipidemia 07/05/2020   Mitral valve prolapse 06/05/2019   Status post right mastectomy 03/18/2019   Bilateral renal masses 08/30/2018   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   History of  recurrent UTIs 12/21/2016   Chronic bilateral low back pain without sciatica 08/16/2016   Enthesopathy of right hip 08/07/2016   Mid back pain, chronic 04/29/2015   Personal history of breast cancer 04/29/2015   Malignant neoplasm of right female breast (HCC) 01/07/2015   Lower urinary tract infectious disease 05/24/2014   Pain in thoracic spine 04/14/2012   Idiopathic scoliosis and kyphoscoliosis 04/14/2012   Pain in joint, pelvic region and thigh 12/13/2005   Other specified acquired hypothyroidism 12/04/2004   Acute sinusitis, unspecified 12/04/2004   Hypoglycemia, unspecified 10/23/2004    History obtained from: chart review and {Persons; PED relatives w/patient:19415::patient}.  Discussed the use of AI scribe software for clinical note transcription with the patient and/or guardian, who gave verbal consent to proceed.  Karmin is a 74 y.o. female presenting for {Blank single:19197::a food challenge,a drug challenge,skin testing,a sick visit,an evaluation of ***,a follow up visit}.  She was last seen in August 2025.  At that time, her lung testing looked terrible but I felt this was likely due to her scoliosis.  We continue with Advair  230/21 mcg 2 puffs once daily with a spacer as well as albuterol  as needed.  For her rhinitis, we decided to do environmental allergy testing.  She had an appointment scheduled in November that she canceled.  She still has not done allergy testing. She did not want to do the allergy testing.   Since last visit, she has done well.  Asthma/Respiratory Symptom History: ***  Allergic Rhinitis Symptom History: ***  She  had allergy testing last performed in June 2013 that was positive to trees, grasses, weeds, dust mites, cat, dog, but otherwise negative. She had been on allergy shots in [place] . This is when she had a dog which was making her symptoms a lot worse.   Food Allergy Symptom History: ***  Skin Symptom History: ***  GERD Symptom  History: ***  Infection Symptom History: ***  Otherwise, there have been no changes to her past medical history, surgical history, family history, or social history.    Review of systems otherwise negative other than that mentioned in the HPI.    Objective:   Pulse 75, temperature 98.3 F (36.8 C), weight 140 lb (63.5 kg), SpO2 97%. Body mass index is 21.29 kg/m.    Physical Exam   Diagnostic studies:    Spirometry: results abnormal (FEV1: 1.24/53%, FVC: 2.03/66%, FEV1/FVC: 61%).    Spirometry consistent with mixed obstructive and restrictive disease.  This is largely stable compared to the last visit, especially with regards to the FEV1.  Her FEV1 was 1.30 L at the last visit and is 1.24 L now.  Allergy Studies: {Blank single:19197::none,deferred due to recent antihistamine use,deferred due to insurance stipulations that require a separate visit for testing,labs sent instead, }    {Blank single:19197::Allergy testing results were read and interpreted by myself, documented by clinical staff., }      Marty Shaggy, MD  [facility]        "

## 2024-12-29 NOTE — Telephone Encounter (Signed)
 Called and spoke with the patient. She wanted to ensure that there were refills at the pharmacy before going. Refills have been sent in. Patient verbalized understanding.

## 2024-12-30 LAB — CMP14+EGFR
ALT: 21 IU/L (ref 0–32)
AST: 23 IU/L (ref 0–40)
Albumin: 5.1 g/dL — ABNORMAL HIGH (ref 3.8–4.8)
Alkaline Phosphatase: 63 IU/L (ref 49–135)
BUN/Creatinine Ratio: 31 — ABNORMAL HIGH (ref 12–28)
BUN: 21 mg/dL (ref 8–27)
Bilirubin Total: 0.3 mg/dL (ref 0.0–1.2)
CO2: 24 mmol/L (ref 20–29)
Calcium: 9.6 mg/dL (ref 8.7–10.3)
Chloride: 98 mmol/L (ref 96–106)
Creatinine, Ser: 0.68 mg/dL (ref 0.57–1.00)
Globulin, Total: 2.1 g/dL (ref 1.5–4.5)
Glucose: 93 mg/dL (ref 70–99)
Potassium: 4.2 mmol/L (ref 3.5–5.2)
Sodium: 137 mmol/L (ref 134–144)
Total Protein: 7.2 g/dL (ref 6.0–8.5)
eGFR: 91 mL/min/1.73

## 2024-12-31 ENCOUNTER — Encounter: Payer: Self-pay | Admitting: Allergy & Immunology

## 2024-12-31 DIAGNOSIS — J4530 Mild persistent asthma, uncomplicated: Secondary | ICD-10-CM

## 2024-12-31 DIAGNOSIS — L299 Pruritus, unspecified: Secondary | ICD-10-CM

## 2024-12-31 DIAGNOSIS — R197 Diarrhea, unspecified: Secondary | ICD-10-CM

## 2024-12-31 LAB — ANTINUCLEAR ANTIBODIES, IFA: ANA Titer 1: NEGATIVE

## 2024-12-31 LAB — ALLERGENS W/COMP RFLX AREA 2

## 2024-12-31 LAB — RHEUMATOID FACTOR: Rheumatoid fact SerPl-aCnc: 10 [IU]/mL

## 2024-12-31 LAB — C-REACTIVE PROTEIN: CRP: <1 mg/L (ref 0–10)

## 2024-12-31 LAB — SEDIMENTATION RATE: Sed Rate: 6 mm/h (ref 0–40)

## 2025-01-01 ENCOUNTER — Other Ambulatory Visit (HOSPITAL_COMMUNITY): Payer: Self-pay

## 2025-01-01 LAB — CBC WITH DIFFERENTIAL/PLATELET
Basophils Absolute: 0.1 x10E3/uL (ref 0.0–0.2)
Basos: 1 %
EOS (ABSOLUTE): 0.1 x10E3/uL (ref 0.0–0.4)
Eos: 3 %
Hematocrit: 37.7 % (ref 34.0–46.6)
Hemoglobin: 12.7 g/dL (ref 11.1–15.9)
Immature Grans (Abs): 0 x10E3/uL (ref 0.0–0.1)
Immature Granulocytes: 0 %
Lymphocytes Absolute: 1.1 x10E3/uL (ref 0.7–3.1)
Lymphs: 28 %
MCH: 31.8 pg (ref 26.6–33.0)
MCHC: 33.7 g/dL (ref 31.5–35.7)
MCV: 95 fL (ref 79–97)
Monocytes Absolute: 0.4 x10E3/uL (ref 0.1–0.9)
Monocytes: 9 %
Neutrophils Absolute: 2.4 x10E3/uL (ref 1.4–7.0)
Neutrophils: 59 %
Platelets: 314 x10E3/uL (ref 150–450)
RBC: 3.99 x10E6/uL (ref 3.77–5.28)
RDW: 11.8 % (ref 11.7–15.4)
WBC: 3.9 x10E3/uL (ref 3.4–10.8)

## 2025-01-01 LAB — IGE: IgE (Immunoglobulin E), Serum: 46 [IU]/mL (ref 6–495)

## 2025-01-01 LAB — ASPERGILLUS PRECIPITINS
A.Fumigatus #1 Abs: NEGATIVE
Aspergillus Flavus Antibodies: NEGATIVE
Aspergillus Niger Antibodies: NEGATIVE
Aspergillus glaucus IgG: NEGATIVE
Aspergillus nidulans IgG: NEGATIVE
Aspergillus terreus IgG: NEGATIVE

## 2025-01-01 LAB — ANCA TITERS
Atypical pANCA: <1:20 {titer}
C-ANCA: <1:20 {titer}
P-ANCA: <1:20 {titer}

## 2025-01-01 LAB — ALPHA-1-ANTITRYPSIN: A-1 Antitrypsin: 67 mg/dL — ABNORMAL LOW (ref 101–187)

## 2025-01-01 NOTE — Telephone Encounter (Signed)
 I have never done a PA in my life. Please advise.   Marty Shaggy, MD [facility] 

## 2025-01-02 LAB — ALLERGENS W/COMP RFLX AREA 2
Bermuda Grass IgE: 0.24 kU/L — AB
Cockroach, German IgE: <0.1 kU/L
Common Silver Birch IgE: <0.1 kU/L
Cottonwood IgE: <0.1 kU/L
D Farinae IgE: 4.41 kU/L — AB
D Pteronyssinus IgE: 3.33 kU/L — AB
E001-IgE Cat Dander: <0.1 kU/L
E005-IgE Dog Dander: 1.02 kU/L — AB
E101-IgE Can f 1: <0.1 kU/L
E230-IGE CAN F 6: <0.1 kU/L
Elm, American IgE: <0.1 kU/L
IgE (Immunoglobulin E), Serum: 38 [IU]/mL (ref 6–495)
Johnson Grass IgE: <0.1 kU/L
Maple/Box Elder IgE: <0.1 kU/L
Mouse Urine IgE: <0.1 kU/L
Oak, White IgE: <0.1 kU/L
Pecan, Hickory IgE: <0.1 kU/L
Penicillium Chrysogen IgE: <0.1 kU/L
Penicillium Chrysogen IgE: <0.1 kU/L
Pigweed, Rough IgE: <0.1 kU/L
Ragweed, Short IgE: <0.1 kU/L
Sheep Sorrel IgE Qn: <0.1 kU/L
Timothy Grass IgE: <0.1 kU/L
Timothy Grass IgE: <0.1 kU/L
White Mulberry IgE: <0.1 kU/L

## 2025-01-02 LAB — PANEL 606686
Can f 4(e229) IgE: <0.1 kU/L
E101-IgE Can f 1: <0.1 kU/L
E102-IgE Can f 2: <0.1 kU/L
E221-IgE Can f 3: 0.71 kU/L — AB
E226-IgE Can f 5: <0.1 kU/L
E230-IGE CAN F 6: <0.1 kU/L

## 2025-01-02 LAB — ALLERGEN COMPONENT COMMENTS

## 2025-01-04 ENCOUNTER — Other Ambulatory Visit

## 2025-01-04 DIAGNOSIS — R197 Diarrhea, unspecified: Secondary | ICD-10-CM

## 2025-01-05 ENCOUNTER — Telehealth: Payer: Self-pay | Admitting: Allergy & Immunology

## 2025-01-05 NOTE — Telephone Encounter (Signed)
 Patient called requesting test result and to please respond the MyChart messages in case she does not answer the phone.

## 2025-01-05 NOTE — Telephone Encounter (Signed)
 There is already 2 MyChart encounters. Once Dr. Iva responds will send message to the patient.

## 2025-01-08 ENCOUNTER — Encounter: Payer: Self-pay | Admitting: Cardiovascular Disease

## 2025-01-08 ENCOUNTER — Telehealth: Payer: Self-pay | Admitting: Physician Assistant

## 2025-01-08 DIAGNOSIS — E782 Mixed hyperlipidemia: Secondary | ICD-10-CM

## 2025-01-08 DIAGNOSIS — R931 Abnormal findings on diagnostic imaging of heart and coronary circulation: Secondary | ICD-10-CM

## 2025-01-08 LAB — PROTEIN ELECTROPHORESIS, SERUM, WITH REFLEX
A/G Ratio: 1.6 (ref 0.7–1.7)
Albumin ELP: 4.2 g/dL (ref 2.9–4.4)
Alpha 1: 0.1 g/dL (ref 0.0–0.4)
Alpha 2: 0.7 g/dL (ref 0.4–1.0)
Beta: 1.1 g/dL (ref 0.7–1.3)
Gamma Globulin: 0.7 g/dL (ref 0.4–1.8)
Globulin, Total: 2.7 g/dL (ref 2.2–3.9)
Total Protein: 6.9 g/dL (ref 6.0–8.5)

## 2025-01-08 LAB — TRYPTASE: Tryptase: 5.3 ug/L (ref 2.2–13.2)

## 2025-01-08 LAB — CHRONIC URTICARIA PD-BAT: Pooled Donor- BAT CU: 3.5 % (ref 0.00–10.60)

## 2025-01-08 NOTE — Telephone Encounter (Signed)
 Note sent via mychart

## 2025-01-09 LAB — PANCREATIC ELASTASE, FECAL: Pancreatic Elastase-1, Stool: >800 ug/g

## 2025-01-10 ENCOUNTER — Ambulatory Visit: Payer: Self-pay | Admitting: Physician Assistant

## 2025-01-10 DIAGNOSIS — E8801 Alpha-1-antitrypsin deficiency: Secondary | ICD-10-CM

## 2025-01-12 ENCOUNTER — Ambulatory Visit: Payer: Self-pay | Admitting: Allergy & Immunology

## 2025-01-12 DIAGNOSIS — E8801 Alpha-1-antitrypsin deficiency: Secondary | ICD-10-CM

## 2025-01-13 ENCOUNTER — Encounter: Payer: Self-pay | Admitting: Physician Assistant

## 2025-01-13 ENCOUNTER — Encounter: Payer: Self-pay | Admitting: Allergy & Immunology

## 2025-01-13 ENCOUNTER — Encounter (INDEPENDENT_AMBULATORY_CARE_PROVIDER_SITE_OTHER): Payer: Self-pay

## 2025-01-13 LAB — ALPHA-1-ANTITRYPSIN DEFICIENCY

## 2025-01-14 ENCOUNTER — Telehealth: Payer: Self-pay | Admitting: *Deleted

## 2025-01-14 DIAGNOSIS — L299 Pruritus, unspecified: Secondary | ICD-10-CM

## 2025-01-14 DIAGNOSIS — E8801 Alpha-1-antitrypsin deficiency: Secondary | ICD-10-CM

## 2025-01-14 NOTE — Telephone Encounter (Signed)
 Spoke to patient regarding [place]ia per fax from her Ins [employer] Prolastin C is preferred product please advise

## 2025-01-14 NOTE — Telephone Encounter (Signed)
 I guess we can just do Prolastin.

## 2025-01-15 NOTE — Addendum Note (Signed)
 Addended by: IVA MARTY SALTNESS on: 01/15/2025 03:10 PM   Modules accepted: Orders

## 2025-01-19 ENCOUNTER — Telehealth: Payer: Self-pay | Admitting: Allergy & Immunology

## 2025-01-19 NOTE — Telephone Encounter (Signed)
 Tanya Snyder called and stated that she has been approved for Prolastin-C, but will need your final approval. She has a Prior Auth Code EPA: [id number]. The website that it can be emailed to is covermymed.com where you will be prompted to enter this EPA.  She is coming up to the office today because she wants to be sure that this is taken care of.

## 2025-01-19 NOTE — Telephone Encounter (Signed)
 I have received something through the fax. I will take a look at it and see what we can do.

## 2025-01-19 NOTE — Telephone Encounter (Signed)
 I have ZERO experience with Prior Authorizations, so spending time with me would be completely useless.

## 2025-01-20 NOTE — Telephone Encounter (Signed)
 PT called back to get update on status of faxes and PA - I advised could not follow along with notations and would need to refer to [facility].  PT stated she expected a call in the next 20-30 minutes or she would drive up to the office

## 2025-01-21 ENCOUNTER — Encounter: Payer: Self-pay | Admitting: Allergy & Immunology

## 2025-01-21 NOTE — Telephone Encounter (Signed)
 Called patient to review the recommendation per Dr. Edwin.  Left voicemail message for patient to call me back. Patient returned call. Reviewed with patient the recommendations per Dr. Iva to try second-generation antihistamine such as Allegra or Xyzal twice a day to help with her itching. Patient will make sure she is moisturizing every day to keep her skin surface intact. She does have a rash associated on her hands and chest at times with the itching. Dr. Iva wants to make sure patient has avoidance measures for dust mites and I will review and give to the patient tomorrow when she is in the office.  Dr. Iva has ordered additional labs and urine tests to be done and patient will come to the office tomorrow to get labs and urine instructions. Patient has a follow up office visit with Dr. Iva 01/28/25 at 9:00 am and she will keep that appointment to discuss lab results. Patient voiced understanding and will call the office back if she has additional questions.

## 2025-01-21 NOTE — Telephone Encounter (Signed)
 I reviewed the notes.  She does have some sensitizations on her allergy testing which might contribute to this including dust mites.  Can we send her avoidance measures for dust mites?  She wanted to avoid first-generation antihistamines due to their association with dementia, so lets try using a second-generation antihistamine such as Allegra or Xyzal twice a day to help control the itching.  Make sure that she is moisturizing every day to keep her skin surface intact.  It looks like she has had a lot of the labs that I would do for chronic itching.  She have any kind of rash associated with this?  I did order some urine studies to look for mast cell activation syndrome as well as 1 more blood test.

## 2025-01-21 NOTE — Addendum Note (Signed)
 Addended by: IVA MARTY SALTNESS on: 01/21/2025 12:33 PM   Modules accepted: Orders

## 2025-01-21 NOTE — Telephone Encounter (Signed)
 I called and left voicemail message for patient to return my call to the office after Norman spoke with patient.   Patient did come to the office and I did spend time with the patient reviewing what information I had to that point regarding her prior authorization and patient assistance approval that was started by Tammy.  I did speak with the patient regarding her infusion and possible locations.   Patient has concerns regarding her itching and how cold helps.  She would like to know what she can do now as she waits on authorization and patient assistance approval.  Informed patient I would speak with Dr. Iva regarding her concerns and would call her on Thursday.SABRA

## 2025-01-26 ENCOUNTER — Other Ambulatory Visit: Payer: Self-pay

## 2025-01-26 ENCOUNTER — Telehealth: Payer: Self-pay

## 2025-01-26 ENCOUNTER — Other Ambulatory Visit: Payer: Self-pay | Admitting: *Deleted

## 2025-01-26 DIAGNOSIS — E8801 Alpha-1-antitrypsin deficiency: Secondary | ICD-10-CM | POA: Insufficient documentation

## 2025-01-26 DIAGNOSIS — L299 Pruritus, unspecified: Secondary | ICD-10-CM

## 2025-01-26 NOTE — Progress Notes (Signed)
 cancel

## 2025-01-27 ENCOUNTER — Telehealth: Payer: Self-pay

## 2025-01-27 DIAGNOSIS — R69 Illness, unspecified: Secondary | ICD-10-CM

## 2025-01-27 NOTE — Telephone Encounter (Signed)
 Copied from CRM #[id number]. Topic: Clinical - Medical Advice >> Jan 27, 2025 12:06 PM Larissa RAMAN wrote: Reason for CRM: Patient calling to inform Dr. Ozell of deficiency  in A-1 Anti Trypsin.

## 2025-01-27 NOTE — Telephone Encounter (Signed)
 Please advise patient on post care treatment

## 2025-01-28 ENCOUNTER — Ambulatory Visit

## 2025-01-28 ENCOUNTER — Ambulatory Visit: Admitting: Allergy & Immunology

## 2025-01-28 ENCOUNTER — Ambulatory Visit (INDEPENDENT_AMBULATORY_CARE_PROVIDER_SITE_OTHER)

## 2025-01-28 VITALS — BP 161/79 | HR 66 | Temp 98.6°F | Resp 16 | Ht 68.0 in | Wt 140.0 lb

## 2025-01-28 DIAGNOSIS — R69 Illness, unspecified: Secondary | ICD-10-CM | POA: Insufficient documentation

## 2025-01-28 DIAGNOSIS — E8801 Alpha-1-antitrypsin deficiency: Secondary | ICD-10-CM

## 2025-01-28 LAB — UPEP/TP, 24-HR URINE

## 2025-01-28 MED ORDER — EMPTY CONTAINERS FLEXIBLE MISC
60.0000 mg/kg | Freq: Once | Status: AC
  Administered 2025-01-28: 3810 mg via INTRAVENOUS
  Filled 2025-01-28: qty 3810

## 2025-01-28 NOTE — Progress Notes (Signed)
 Diagnosis: Alpha-1-antitrypsin deficiency  Provider:  Mannam, Praveen MD  Procedure: IV Infusion  IV Type: Peripheral, IV Location: L Forearm  Aralast -NP (alpha-1-proteinase inhibitor  (human), Dose: 3,810 mg  Infusion Start Time: 1230  Infusion Stop Time: 1301  Post Infusion IV Care: Observation period completed and Peripheral IV Discontinued  Discharge: Condition: Good, Destination: Home . AVS Provided  Performed by:  Rocky FORBES Search, RN

## 2025-01-28 NOTE — Telephone Encounter (Signed)
 Noted ok to add to problem list and close.

## 2025-01-28 NOTE — Telephone Encounter (Signed)
 Added under diagnosis unknown as unable to locate exact diagnosis below.

## 2025-01-28 NOTE — Telephone Encounter (Signed)
 Sent to me directly..I did see they are still trying to get her infusion here there has been some holdups with deliveries due to weather. If they get it in [place]cey has advised them to reach out to her

## 2025-01-28 NOTE — Addendum Note (Signed)
 Addended by: IVA MARTY SALTNESS on: 01/28/2025 01:29 PM   Modules accepted: Orders

## 2025-01-29 ENCOUNTER — Ambulatory Visit: Admitting: Physician Assistant

## 2025-01-29 ENCOUNTER — Other Ambulatory Visit: Payer: Self-pay | Admitting: Medical Genetics

## 2025-01-29 LAB — 2,3-DINOR 11BPG F2, 24HR URINE

## 2025-01-29 LAB — UPEP/TP, 24-HR URINE
Albumin, U: 100
Alpha 1, Urine: 0
Alpha 2, Urine: 0
Beta, Urine: 0
Gamma Globulin, Urine: 0
Protein, 24H Urine: <120 mg/(24.h) (ref 30–150)
Protein, Ur: <4 mg/dL

## 2025-01-29 LAB — LEUKOTRIENE E4, 24 HR, U

## 2025-01-29 LAB — N-METHYLHISTAMINE, 24 HR, U

## 2025-01-29 LAB — PROSTAGLANDIN D2/CREATININE, U

## 2025-02-01 ENCOUNTER — Ambulatory Visit: Admitting: Family Medicine

## 2025-02-03 ENCOUNTER — Ambulatory Visit (HOSPITAL_COMMUNITY)

## 2025-02-05 ENCOUNTER — Ambulatory Visit

## 2025-02-12 ENCOUNTER — Ambulatory Visit

## 2025-03-03 ENCOUNTER — Ambulatory Visit (HOSPITAL_BASED_OUTPATIENT_CLINIC_OR_DEPARTMENT_OTHER): Admitting: Pulmonary Disease

## 2025-03-30 ENCOUNTER — Ambulatory Visit: Admitting: Allergy & Immunology
